# Patient Record
Sex: Male | Born: 1945 | Race: White | Hispanic: No | Marital: Single | Smoking: Former smoker
Health system: Southern US, Community
[De-identification: ages and names within clinical notes are randomized; demographics above are authoritative.]

## PROBLEM LIST (undated history)

## (undated) DIAGNOSIS — J449 Chronic obstructive pulmonary disease, unspecified: Secondary | ICD-10-CM

## (undated) DIAGNOSIS — I779 Disorder of arteries and arterioles, unspecified: Secondary | ICD-10-CM

## (undated) DIAGNOSIS — E785 Hyperlipidemia, unspecified: Secondary | ICD-10-CM

## (undated) DIAGNOSIS — I739 Peripheral vascular disease, unspecified: Secondary | ICD-10-CM

## (undated) DIAGNOSIS — I252 Old myocardial infarction: Secondary | ICD-10-CM

## (undated) DIAGNOSIS — I255 Ischemic cardiomyopathy: Secondary | ICD-10-CM

## (undated) DIAGNOSIS — I701 Atherosclerosis of renal artery: Secondary | ICD-10-CM

## (undated) DIAGNOSIS — I5022 Chronic systolic (congestive) heart failure: Secondary | ICD-10-CM

## (undated) DIAGNOSIS — N189 Chronic kidney disease, unspecified: Secondary | ICD-10-CM

## (undated) DIAGNOSIS — I2510 Atherosclerotic heart disease of native coronary artery without angina pectoris: Secondary | ICD-10-CM

## (undated) DIAGNOSIS — I10 Essential (primary) hypertension: Secondary | ICD-10-CM

## (undated) HISTORY — DX: Hyperlipidemia, unspecified: E78.5

## (undated) HISTORY — DX: Chronic kidney disease, unspecified: N18.9

## (undated) HISTORY — DX: Atherosclerotic heart disease of native coronary artery without angina pectoris: I25.10

## (undated) HISTORY — DX: Disorder of arteries and arterioles, unspecified: I77.9

## (undated) HISTORY — DX: Chronic systolic (congestive) heart failure: I50.22

## (undated) HISTORY — DX: Ischemic cardiomyopathy: I25.5

## (undated) HISTORY — DX: Peripheral vascular disease, unspecified: I73.9

## (undated) HISTORY — DX: Old myocardial infarction: I25.2

## (undated) HISTORY — DX: Essential (primary) hypertension: I10

## (undated) HISTORY — DX: Atherosclerosis of renal artery: I70.1

---

## 2006-05-21 DIAGNOSIS — I252 Old myocardial infarction: Secondary | ICD-10-CM

## 2006-05-21 DIAGNOSIS — I2510 Atherosclerotic heart disease of native coronary artery without angina pectoris: Secondary | ICD-10-CM

## 2006-05-21 HISTORY — PX: CORONARY ARTERY BYPASS GRAFT: SHX141

## 2006-05-21 HISTORY — DX: Atherosclerotic heart disease of native coronary artery without angina pectoris: I25.10

## 2006-05-21 HISTORY — PX: MITRAL VALVE ANNULOPLASTY: SHX2038

## 2006-05-21 HISTORY — DX: Old myocardial infarction: I25.2

## 2007-02-24 ENCOUNTER — Inpatient Hospital Stay (HOSPITAL_COMMUNITY): Admission: EM | Admit: 2007-02-24 | Discharge: 2007-03-04 | Payer: Self-pay | Admitting: Cardiovascular Disease

## 2007-02-24 ENCOUNTER — Ambulatory Visit: Payer: Self-pay | Admitting: Thoracic Surgery (Cardiothoracic Vascular Surgery)

## 2007-02-24 ENCOUNTER — Ambulatory Visit: Payer: Self-pay | Admitting: Cardiovascular Disease

## 2007-03-17 ENCOUNTER — Encounter
Admission: RE | Admit: 2007-03-17 | Discharge: 2007-03-17 | Payer: Self-pay | Admitting: Thoracic Surgery (Cardiothoracic Vascular Surgery)

## 2007-03-17 ENCOUNTER — Ambulatory Visit: Payer: Self-pay | Admitting: Thoracic Surgery (Cardiothoracic Vascular Surgery)

## 2007-03-26 ENCOUNTER — Ambulatory Visit: Payer: Self-pay | Admitting: Cardiovascular Disease

## 2007-04-04 ENCOUNTER — Ambulatory Visit: Payer: Self-pay | Admitting: Cardiothoracic Surgery

## 2007-04-04 ENCOUNTER — Encounter: Admission: RE | Admit: 2007-04-04 | Discharge: 2007-04-04 | Payer: Self-pay | Admitting: Cardiothoracic Surgery

## 2007-04-30 ENCOUNTER — Encounter: Payer: Self-pay | Admitting: Cardiovascular Disease

## 2007-04-30 ENCOUNTER — Ambulatory Visit: Payer: Self-pay | Admitting: Cardiovascular Disease

## 2007-04-30 ENCOUNTER — Ambulatory Visit: Payer: Self-pay

## 2007-04-30 LAB — CONVERTED CEMR LAB
ALT: 13 units/L (ref 0–53)
Albumin: 3.5 g/dL (ref 3.5–5.2)
Alkaline Phosphatase: 117 units/L (ref 39–117)
BUN: 21 mg/dL (ref 6–23)
Cholesterol: 110 mg/dL (ref 0–200)
Eosinophils Relative: 4.4 % (ref 0.0–5.0)
GFR calc Af Amer: 61 mL/min
HCT: 33.2 % — ABNORMAL LOW (ref 39.0–52.0)
Hemoglobin: 11 g/dL — ABNORMAL LOW (ref 13.0–17.0)
LDL Cholesterol: 69 mg/dL (ref 0–99)
Lymphocytes Relative: 27.8 % (ref 12.0–46.0)
MCHC: 33.1 g/dL (ref 30.0–36.0)
MCV: 85.5 fL (ref 78.0–100.0)
Monocytes Relative: 6.8 % (ref 3.0–11.0)
Neutrophils Relative %: 60.5 % (ref 43.0–77.0)
RBC: 3.88 M/uL — ABNORMAL LOW (ref 4.22–5.81)
RDW: 15.9 % — ABNORMAL HIGH (ref 11.5–14.6)
Sodium: 141 meq/L (ref 135–145)
Total CHOL/HDL Ratio: 4.1
Total Protein: 7.4 g/dL (ref 6.0–8.3)
Triglycerides: 74 mg/dL (ref 0–149)
VLDL: 15 mg/dL (ref 0–40)
WBC: 9.3 10*3/uL (ref 4.5–10.5)

## 2007-07-01 ENCOUNTER — Ambulatory Visit: Payer: Self-pay | Admitting: Cardiovascular Disease

## 2007-07-28 ENCOUNTER — Ambulatory Visit: Payer: Self-pay | Admitting: Cardiovascular Disease

## 2007-09-29 ENCOUNTER — Ambulatory Visit: Payer: Self-pay | Admitting: Cardiovascular Disease

## 2007-09-29 ENCOUNTER — Encounter: Payer: Self-pay | Admitting: Cardiovascular Disease

## 2007-09-29 ENCOUNTER — Ambulatory Visit: Payer: Self-pay

## 2008-01-23 ENCOUNTER — Ambulatory Visit: Payer: Self-pay | Admitting: Cardiovascular Disease

## 2008-01-23 LAB — CONVERTED CEMR LAB
ALT: 16 units/L (ref 0–53)
Albumin: 3.6 g/dL (ref 3.5–5.2)
Basophils Absolute: 0.1 10*3/uL (ref 0.0–0.1)
Basophils Relative: 0.9 % (ref 0.0–3.0)
Calcium: 9.2 mg/dL (ref 8.4–10.5)
Cholesterol: 88 mg/dL (ref 0–200)
Creatinine, Ser: 1.5 mg/dL (ref 0.4–1.5)
GFR calc non Af Amer: 51 mL/min
Glucose, Bld: 98 mg/dL (ref 70–99)
HCT: 36.8 % — ABNORMAL LOW (ref 39.0–52.0)
Hemoglobin: 12.5 g/dL — ABNORMAL LOW (ref 13.0–17.0)
LDL Cholesterol: 52 mg/dL (ref 0–99)
Lymphocytes Relative: 22.7 % (ref 12.0–46.0)
MCV: 81.6 fL (ref 78.0–100.0)
Monocytes Relative: 7.7 % (ref 3.0–12.0)
Potassium: 3.9 meq/L (ref 3.5–5.1)
RBC: 4.5 M/uL (ref 4.22–5.81)
Sodium: 145 meq/L (ref 135–145)
Total CHOL/HDL Ratio: 3.3
Total Protein: 6.8 g/dL (ref 6.0–8.3)
Triglycerides: 51 mg/dL (ref 0–149)
WBC: 7.5 10*3/uL (ref 4.5–10.5)

## 2008-05-03 ENCOUNTER — Ambulatory Visit: Payer: Self-pay | Admitting: Cardiovascular Disease

## 2008-05-03 LAB — CONVERTED CEMR LAB
ALT: 16 units/L (ref 0–53)
AST: 21 units/L (ref 0–37)
Alkaline Phosphatase: 163 units/L — ABNORMAL HIGH (ref 39–117)
Total Protein: 6.6 g/dL (ref 6.0–8.3)

## 2008-05-13 DIAGNOSIS — E785 Hyperlipidemia, unspecified: Secondary | ICD-10-CM | POA: Insufficient documentation

## 2008-05-13 DIAGNOSIS — I5022 Chronic systolic (congestive) heart failure: Secondary | ICD-10-CM | POA: Insufficient documentation

## 2008-05-13 DIAGNOSIS — I2589 Other forms of chronic ischemic heart disease: Secondary | ICD-10-CM

## 2008-05-13 DIAGNOSIS — I25810 Atherosclerosis of coronary artery bypass graft(s) without angina pectoris: Secondary | ICD-10-CM

## 2008-06-30 ENCOUNTER — Ambulatory Visit: Payer: Self-pay | Admitting: Cardiology

## 2008-07-21 ENCOUNTER — Ambulatory Visit: Payer: Self-pay | Admitting: Cardiovascular Disease

## 2008-07-30 ENCOUNTER — Encounter: Payer: Self-pay | Admitting: Cardiovascular Disease

## 2008-07-30 ENCOUNTER — Ambulatory Visit: Payer: Self-pay | Admitting: Cardiovascular Disease

## 2008-07-30 LAB — CONVERTED CEMR LAB
CO2: 32 meq/L (ref 19–32)
Creatinine, Ser: 1.3 mg/dL (ref 0.4–1.5)
GFR calc non Af Amer: 60 mL/min
Glucose, Bld: 78 mg/dL (ref 70–99)
Potassium: 4.2 meq/L (ref 3.5–5.1)

## 2008-08-31 ENCOUNTER — Encounter: Payer: Self-pay | Admitting: Cardiovascular Disease

## 2008-08-31 ENCOUNTER — Ambulatory Visit: Payer: Self-pay | Admitting: Cardiovascular Disease

## 2008-09-01 LAB — CONVERTED CEMR LAB
BUN: 23 mg/dL (ref 6–23)
Calcium: 9.2 mg/dL (ref 8.4–10.5)
Creatinine, Ser: 1.6 mg/dL — ABNORMAL HIGH (ref 0.4–1.5)
GFR calc non Af Amer: 46.82 mL/min (ref >60)
Glucose, Bld: 90 mg/dL (ref 70–99)
Pro B Natriuretic peptide (BNP): 672 pg/mL — ABNORMAL HIGH (ref 0.0–100.0)
Sodium: 146 meq/L — ABNORMAL HIGH (ref 135–145)

## 2008-10-25 ENCOUNTER — Ambulatory Visit: Payer: Self-pay | Admitting: Cardiovascular Disease

## 2009-03-22 ENCOUNTER — Ambulatory Visit: Payer: Self-pay | Admitting: Cardiovascular Disease

## 2009-03-30 LAB — CONVERTED CEMR LAB
ALT: 27 units/L (ref 0–53)
AST: 26 units/L (ref 0–37)
Albumin: 3.9 g/dL (ref 3.5–5.2)
Alkaline Phosphatase: 228 units/L — ABNORMAL HIGH (ref 39–117)
Chloride: 106 meq/L (ref 96–112)
Direct LDL: 91.2 mg/dL
GFR calc non Af Amer: 29.27 mL/min (ref >60)
Sodium: 142 meq/L (ref 135–145)
Total CHOL/HDL Ratio: 5
Total Protein: 7.5 g/dL (ref 6.0–8.3)

## 2009-07-04 ENCOUNTER — Ambulatory Visit: Payer: Self-pay | Admitting: Cardiovascular Disease

## 2009-07-04 LAB — CONVERTED CEMR LAB
BUN: 19 mg/dL (ref 6–23)
Calcium: 8.9 mg/dL (ref 8.4–10.5)
Creatinine, Ser: 2 mg/dL — ABNORMAL HIGH (ref 0.4–1.5)
GFR calc non Af Amer: 36.09 mL/min (ref >60)
Sodium: 145 meq/L (ref 135–145)

## 2009-07-16 IMAGING — CR DG CHEST 2V
2 series · 2 of 2 positions shown · non-contrast
Comparison: 03/01/07.

CLINICAL DATA: 61-year-old male acute MI.  Status post coronary bypass, cough, and congestion.
CHEST - 2 VIEW:

[w chest pa]
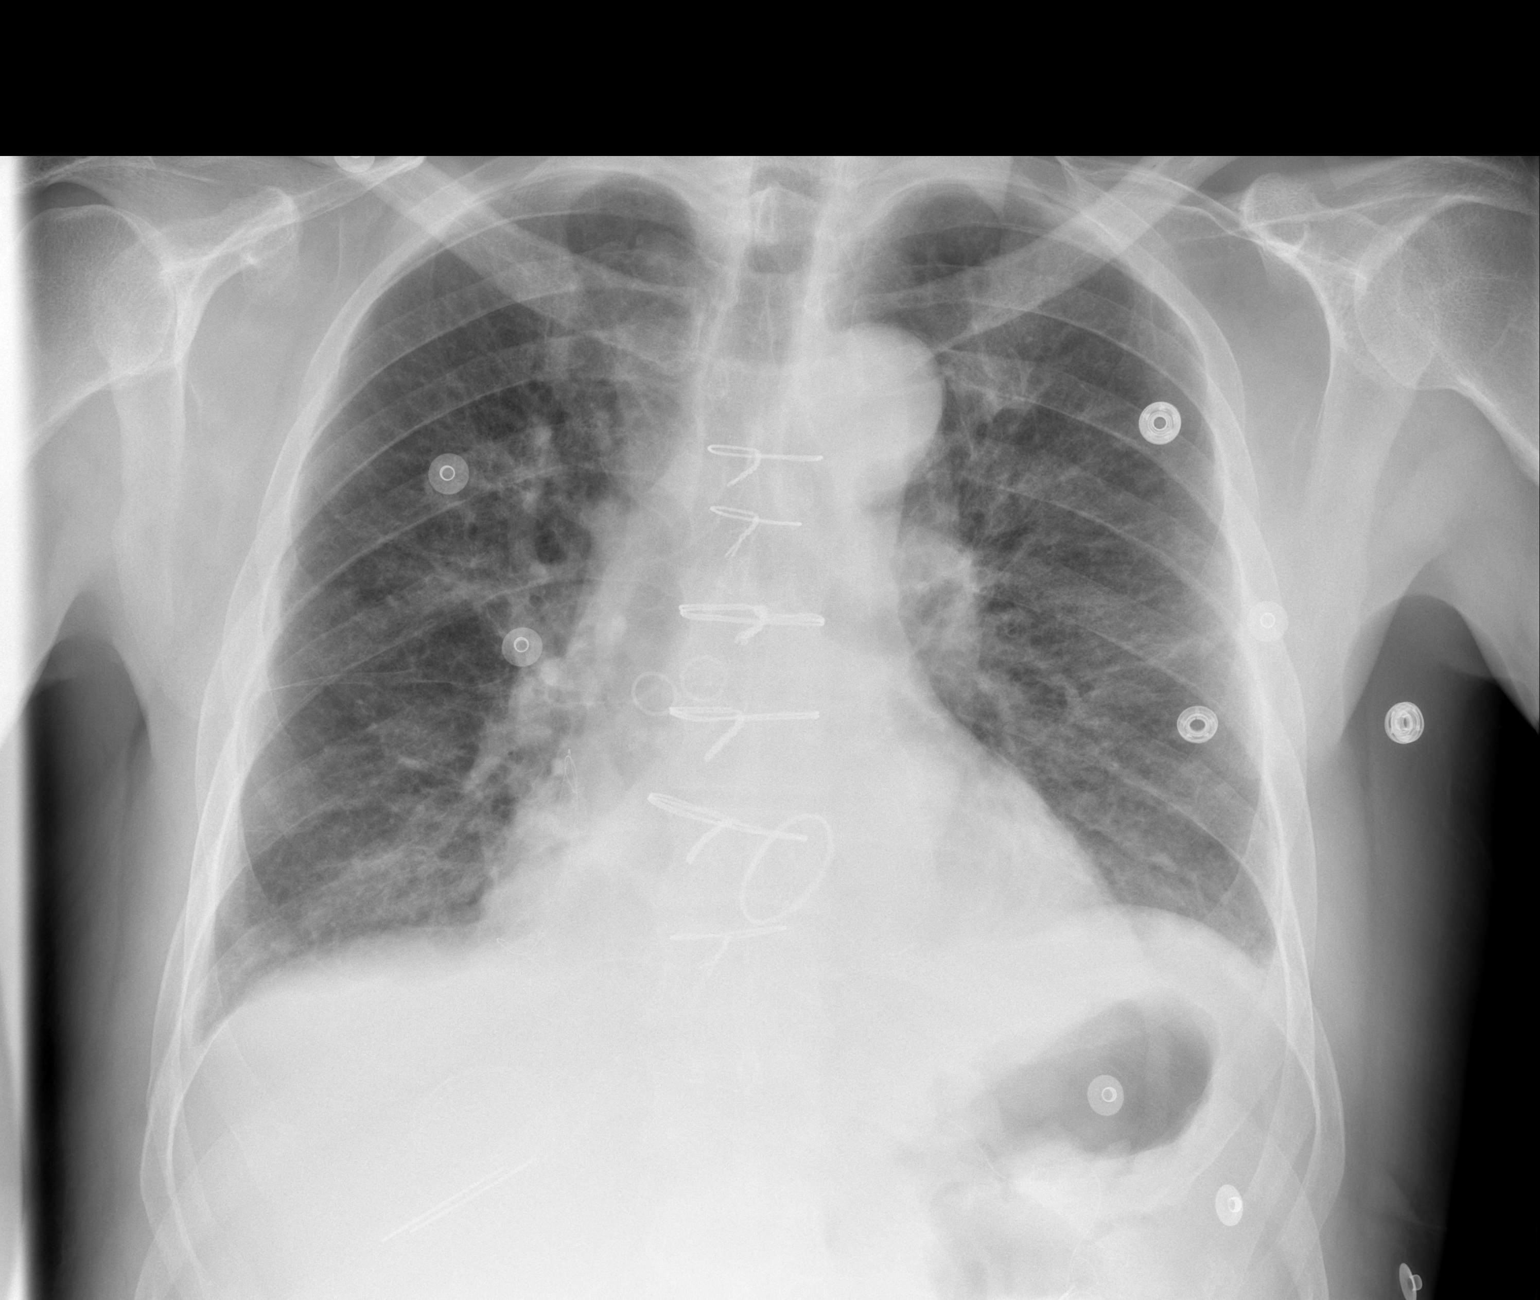

[w chest lat]
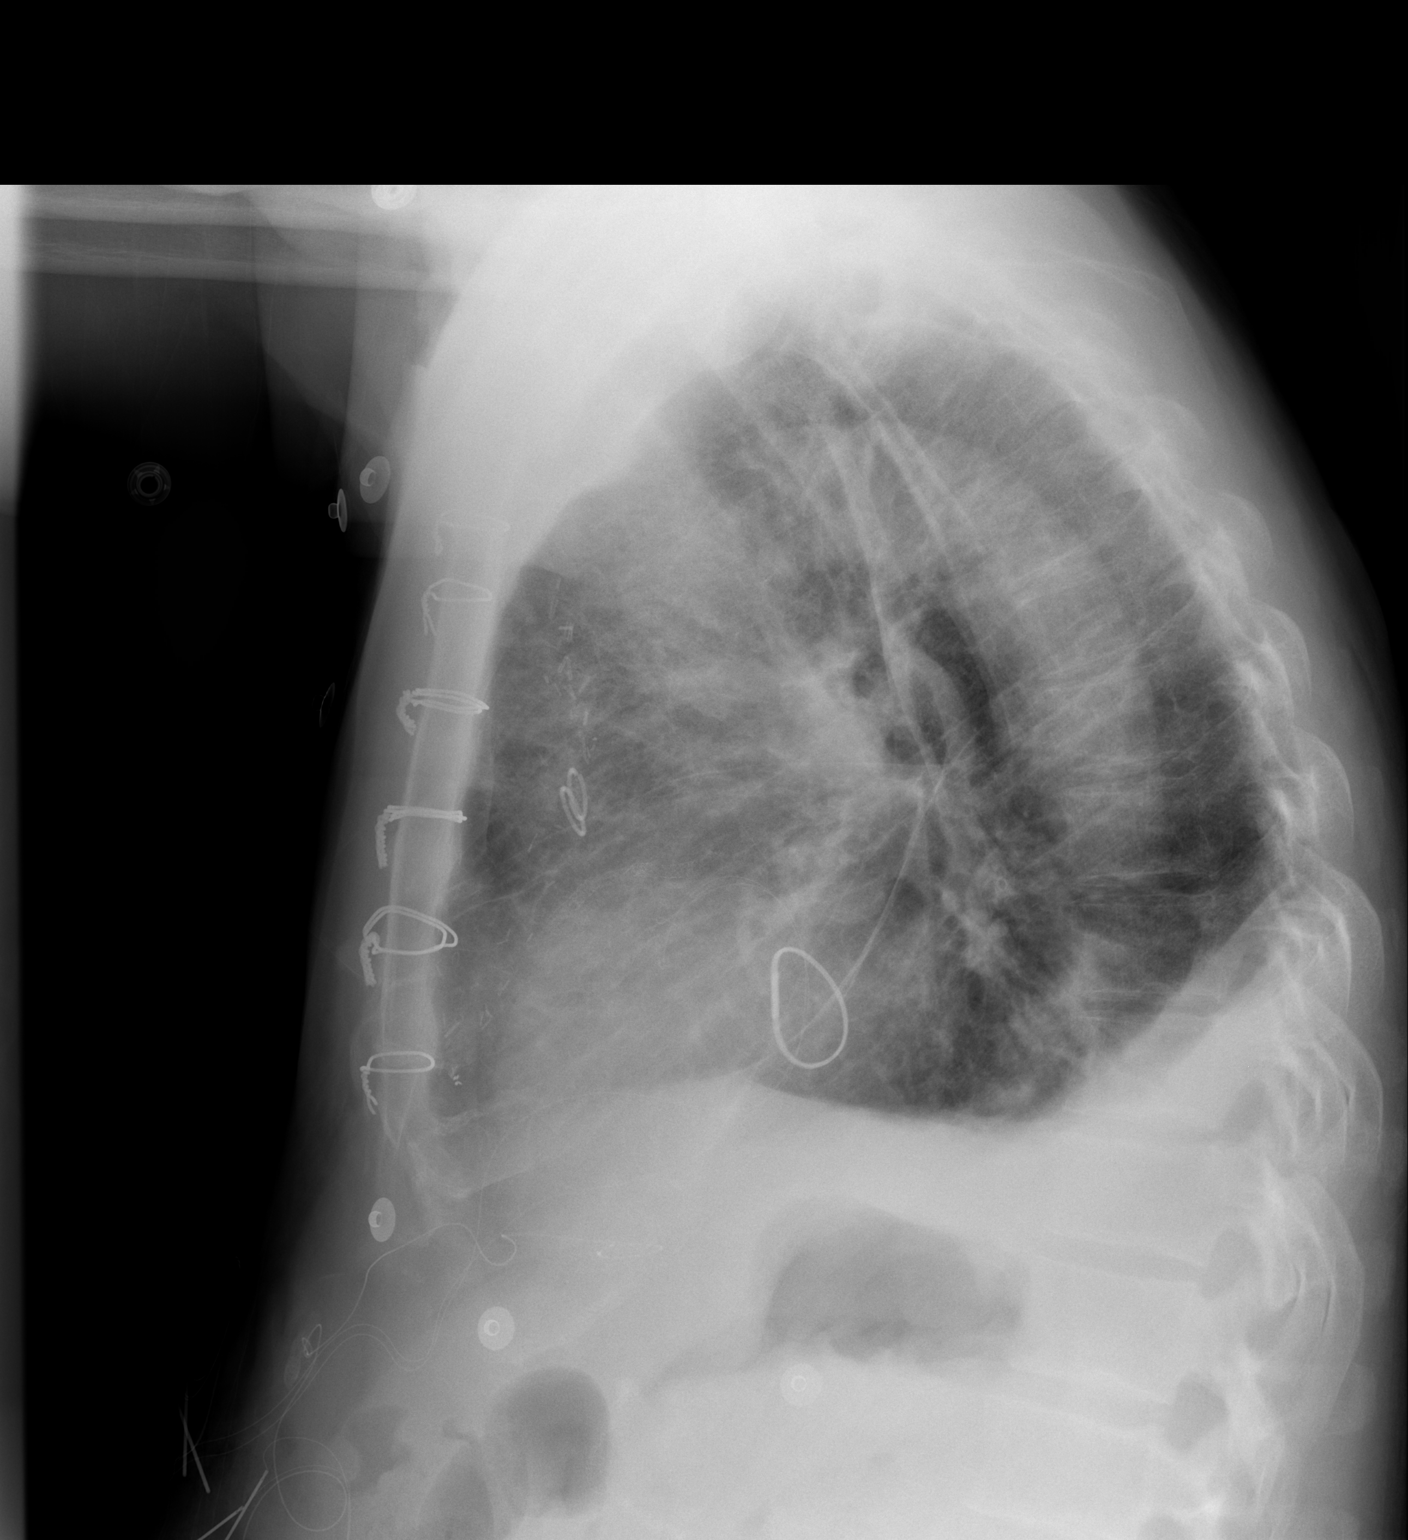

[2 of 2 positions shown; findings below may reference images not displayed]

FINDINGS: The right IJ vascular sheath has been removed. The patient is status post median sternotomy for coronary bypass and prosthetic heart valve. The heart is enlarged with improving edema and bibasilar atelectasis. Small effusions persist. No pneumothorax.
IMPRESSION: Improving edema and atelectasis.

## 2009-09-27 ENCOUNTER — Ambulatory Visit: Payer: Self-pay | Admitting: Cardiovascular Disease

## 2010-01-30 ENCOUNTER — Ambulatory Visit: Payer: Self-pay | Admitting: Cardiovascular Disease

## 2010-02-03 ENCOUNTER — Telehealth: Payer: Self-pay | Admitting: Cardiovascular Disease

## 2010-02-03 DIAGNOSIS — I701 Atherosclerosis of renal artery: Secondary | ICD-10-CM

## 2010-02-03 LAB — CONVERTED CEMR LAB
AST: 16 units/L (ref 0–37)
Albumin: 4 g/dL (ref 3.5–5.2)
Alkaline Phosphatase: 156 units/L — ABNORMAL HIGH (ref 39–117)
BUN: 26 mg/dL — ABNORMAL HIGH (ref 6–23)
CO2: 27 meq/L (ref 19–32)
GFR calc non Af Amer: 31.13 mL/min (ref >60)
Glucose, Bld: 103 mg/dL — ABNORMAL HIGH (ref 70–99)
LDL Cholesterol: 67 mg/dL (ref 0–99)
Total CHOL/HDL Ratio: 5
Total Protein: 7.1 g/dL (ref 6.0–8.3)

## 2010-02-15 ENCOUNTER — Encounter: Payer: Self-pay | Admitting: Cardiovascular Disease

## 2010-02-15 ENCOUNTER — Ambulatory Visit: Payer: Self-pay

## 2010-05-25 ENCOUNTER — Encounter: Payer: Self-pay | Admitting: Cardiovascular Disease

## 2010-05-25 ENCOUNTER — Ambulatory Visit
Admission: RE | Admit: 2010-05-25 | Discharge: 2010-05-25 | Payer: Self-pay | Source: Home / Self Care | Attending: Cardiovascular Disease | Admitting: Cardiovascular Disease

## 2010-06-20 NOTE — Progress Notes (Signed)
Summary: Results  Phone Note Call from Patient Call back at Home Phone [phone]   Caller: Patient Reason for Call: Talk to Nurse, Talk to Doctor, Lab or Test Results Summary of Call: pt rtn call to get lab results Initial call taken by: Omer Jack,  February 03, 2010 9:13 AM  Follow-up for Phone Call        Spoke with pt. Patient aware of labs results and MD recommendations. A renal duplex was  order. Pt. is aware that the scheduler will call him for date and time of test. Pt. verbalized uinderstanding Follow-up by: Ollen Gross, RN, BSN,  February 03, 2010 9:54 AM  New Problems: RENAL ARTERY STENOSIS (ICD-440.1)   New Problems: RENAL ARTERY STENOSIS (ICD-440.1)

## 2010-06-20 NOTE — Assessment & Plan Note (Signed)
Summary: f60m   Visit Type:  Follow-up Primary Provider:  none  CC:  No complains.  History of Present Illness: 65 year-old male with severe ischemic cardiomyopathy and chronic heart failure presents today for follow-up.   LVEF is about 30% with LBBB, and he has declined cardiac resynchronization or a defibrillator.   The patient denies chest pain, orthopnea, PND, edema, palpitations, lightheadedness, or syncope.  He has stable exertional dyspnea. He's not engaged in regular exercise. He has been out fishing with his son, and he goes for an occasional walk. No symptoms with low-level physical activities.   Current Medications (verified): 1)  Digoxin 0.125 Mg Tabs (Digoxin) .... Take 1 Tablet By Mouth Once A Day 2)  Potassium Chloride 20 Meq/81ml Soln (Potassium Chloride) .... Take One Tablet Once Daily 3)  Carvedilol 6.25 Mg Tabs (Carvedilol) .... Take One Tablet Two Times A Day 4)  Furosemide 80 Mg Tabs (Furosemide) .... Take One Tablet By Mouth Daily 5)  Simvastatin 40 Mg Tabs (Simvastatin) .... Take 1 Tablet By Mouth Once A Day 6)  Aspirin Ec 325 Mg Tbec (Aspirin) .... Take One Tablet By Mouth Daily 7)  Diovan 40 Mg Tabs (Valsartan) .... Take One Tablet By Mouth Twice A Day  Allergies (verified): No Known Drug Allergies  Past History:  Past medical history reviewed for relevance to current acute and chronic problems.  Past Medical History: Reviewed history from 07/04/2009 and no changes required. CAD s/p multiple MI's and emergency CABG 2008 Dyslipidemia COPD Severe Ischemic Cardiomyopathy, LVEF less than 30% - has refused ICD Hypertension Tobacco Abuse - former  Review of Systems       Negative except as per HPI   Vital Signs:  Patient profile:   65 year old male Height:      70 inches Weight:      178.50 pounds BMI:     25.70 Pulse rate:   56 / minute Pulse rhythm:   regular Resp:     18 per minute BP sitting:   110 / 74  (left arm) Cuff size:    large  Vitals Entered By: Vikki Ports (January 30, 2010 8:58 AM)  Physical Exam  General:  Pt is alert and oriented, in no acute distress. HEENT: normal Neck: normal carotid upstrokes without bruits, JVP normal Lungs: CTA CV: RRR without murmur or gallop Abd: soft, NT, positive BS, no bruit, no organomegaly Ext: no clubbing, cyanosis, or edema. peripheral pulses 2+ and equal Skin: warm and dry without rash    Impression & Recommendations:  Problem # 1:  CAD, ARTERY BYPASS GRAFT (ICD-414.04)  Pt remains stable without angina. Will continue his current medical program and see him back in 4 months.  His updated medication list for this problem includes:    Carvedilol 6.25 Mg Tabs (Carvedilol) .Marland Kitchen... Take one tablet two times a day    Aspirin Ec 325 Mg Tbec (Aspirin) .Marland Kitchen... Take one tablet by mouth daily  Orders: TLB-BMP (Basic Metabolic Panel-BMET) (80048-METABOL) TLB-Lipid Panel (80061-LIPID) TLB-Hepatic/Liver Function Pnl (80076-HEPATIC)  Problem # 2:  SYSTOLIC HEART FAILURE, CHRONIC (ICD-428.22) Pt stable without evidence of volume overload. Continue meds as below and followup with a CXR, metabolic panel.  His updated medication list for this problem includes:    Digoxin 0.125 Mg Tabs (Digoxin) .Marland Kitchen... Take 1 tablet by mouth once a day    Carvedilol 6.25 Mg Tabs (Carvedilol) .Marland Kitchen... Take one tablet two times a day    Furosemide 80 Mg Tabs (Furosemide) .Marland Kitchen... Take  one tablet by mouth daily    Aspirin Ec 325 Mg Tbec (Aspirin) .Marland Kitchen... Take one tablet by mouth daily    Diovan 40 Mg Tabs (Valsartan) .Marland Kitchen... Take one tablet by mouth twice a day  Problem # 3:  HYPERLIPIDEMIA-MIXED (ICD-272.4) Draw lipids this am as pt is fasting.  His updated medication list for this problem includes:    Simvastatin 40 Mg Tabs (Simvastatin) .Marland Kitchen... Take 1 tablet by mouth once a day  CHOL: 143 (03/22/2009)   LDL: 52 (01/23/2008)   HDL: 30.10 (03/22/2009)   TG: 207.0 (03/22/2009)  Orders: TLB-Lipid  Panel (80061-LIPID)  Patient Instructions: 1)  Your physician recommends that you schedule a follow-up appointment in: 4 months 2)  Your physician recommends that you have a FASTING lipid profile, liver function test, & BMP today. 3)  Your physician recommends that you continue on your current medications as directed. Please refer to the Current Medication list given to you today. 4)  A chest x-ray will be obtained in 4 months.

## 2010-06-20 NOTE — Assessment & Plan Note (Signed)
Summary: 3 month rov   Visit Type:  Follow-up Primary Provider:  none  CC:  no complaints.  History of Present Illness: 65 year-old male with severe ischemic cardiomyopathy and chronic heart failure presents today for follow-up.   LVEF is about 30% with LBBB, and he has declined cardiac resynchronization or a defibrillator. He has no symptoms at his current activity level. The patient denies chest pain, dyspnea, orthopnea, PND, edema, palpitations, lightheadedness, or syncope. He is not engaged in exercise.   Current Medications (verified): 1)  Digoxin 0.125 Mg Tabs (Digoxin) .... Take 1 Tablet By Mouth Once A Day 2)  Potassium Chloride 20 Meq/39ml Soln (Potassium Chloride) .... Take One Tablet Once Daily 3)  Carvedilol 6.25 Mg Tabs (Carvedilol) .... Take One Tablet Two Times A Day 4)  Furosemide 80 Mg Tabs (Furosemide) .... Take One Tablet By Mouth Daily 5)  Simvastatin 40 Mg Tabs (Simvastatin) .... Take 1 Tablet By Mouth Once A Day 6)  Aspirin Ec 325 Mg Tbec (Aspirin) .... Take One Tablet By Mouth Daily 7)  Diovan 40 Mg Tabs (Valsartan) .... Take One Tablet By Mouth Twice A Day  Allergies: No Known Drug Allergies  Past History:  Past medical history reviewed for relevance to current acute and chronic problems.  Past Medical History: Reviewed history from 07/04/2009 and no changes required. CAD s/p multiple MI's and emergency CABG 2008 Dyslipidemia COPD Severe Ischemic Cardiomyopathy, LVEF less than 30% - has refused ICD Hypertension Tobacco Abuse - former  Review of Systems       Negative except as per HPI   Vital Signs:  Patient profile:   65 year old male Height:      70 inches Weight:      181 pounds Pulse rate:   64 / minute Pulse rhythm:   regular Resp:     16 per minute BP sitting:   92 / 64  (right arm)  Vitals Entered By: Jacquelin Hawking, CMA (Sep 27, 2009 10:13 AM)  Physical Exam  General:  Pt is alert and oriented, in no acute distress. HEENT:  normal Neck: normal carotid upstrokes without bruits, JVP normal Lungs: CTA CV: RRR without murmur or gallop Abd: soft, NT, positive BS, no bruit, no organomegaly Ext: no clubbing, cyanosis, or edema. peripheral pulses 2+ and equal Skin: warm and dry without rash    EKG  Procedure date:  09/27/2009  Findings:      Sinus brady, LBBB, HR 56 bpm, 1st degree AVB.  Impression & Recommendations:  Problem # 1:  CAD, ARTERY BYPASS GRAFT (ICD-414.04) Stable without angina. Continue current Rx.  His updated medication list for this problem includes:    Carvedilol 6.25 Mg Tabs (Carvedilol) .Marland Kitchen... Take one tablet two times a day    Aspirin Ec 325 Mg Tbec (Aspirin) .Marland Kitchen... Take one tablet by mouth daily  Orders: EKG w/ Interpretation (93000)  Problem # 2:  SYSTOLIC HEART FAILURE, CHRONIC (ICD-428.22) Pt is stable.  BP remains about 90 mmHg preculuding uptitration of meds.  His updated medication list for this problem includes:    Digoxin 0.125 Mg Tabs (Digoxin) .Marland Kitchen... Take 1 tablet by mouth once a day    Carvedilol 6.25 Mg Tabs (Carvedilol) .Marland Kitchen... Take one tablet two times a day    Furosemide 80 Mg Tabs (Furosemide) .Marland Kitchen... Take one tablet by mouth daily    Aspirin Ec 325 Mg Tbec (Aspirin) .Marland Kitchen... Take one tablet by mouth daily    Diovan 40 Mg Tabs (Valsartan) .Marland Kitchen... Take  one tablet by mouth twice a day  Orders: EKG w/ Interpretation (93000)  Problem # 3:  HYPERLIPIDEMIA-MIXED (ICD-272.4) Will f/u labs when he returns in 4 months.  His updated medication list for this problem includes:    Simvastatin 40 Mg Tabs (Simvastatin) .Marland Kitchen... Take 1 tablet by mouth once a day  CHOL: 143 (03/22/2009)   LDL: 52 (01/23/2008)   HDL: 30.10 (03/22/2009)   TG: 207.0 (03/22/2009)  Patient Instructions: 1)  Your physician recommends that you schedule a follow-up appointment in: 4 MONTHS 2)  Your physician recommends that you return for lab work in: 4 MONTHS (LIPID, BMP, LIVER 428.22, 272.0, 414.04)--Nothing  to eat or drink after midnight for bloodwork 3)  A chest x-ray takes a picture of the organs and structures inside the chest, including the heart, lungs, and blood vessels. This test can show several things, including, whether the heart is enlarged; whether fluid is building up in the lungs; and whether pacemaker / defibrillator leads are still in place.  This will be done in 4 MONTHS. 4)  Your physician recommends that you continue on your current medications as directed. Please refer to the Current Medication list given to you today.

## 2010-06-20 NOTE — Assessment & Plan Note (Signed)
Summary: ROV   Visit Type:  3 months follow up Primary Provider:  none  CC:  No complaints.  History of Present Illness: 65 year-old male with severe ischemic cardiomyopathy and chronic heart failure presents today for follow-up.   He is doing well at present.  Reports compliance with his medications. Appetite is good. No shortness of breath, orthopnea, PND, edema, or chest pain. No lightheadedness or syncope. No complaints today. He has been sedentary - not engaged in regular walking or exercise.  Current Medications (verified): 1)  Digoxin 0.125 Mg Tabs (Digoxin) .... Take 1 Tablet By Mouth Once A Day 2)  Potassium Chloride 20 Meq/67ml Soln (Potassium Chloride) .... Take One Tablet Once Daily 3)  Carvedilol 6.25 Mg Tabs (Carvedilol) .... Take One Tablet Two Times A Day 4)  Furosemide 80 Mg Tabs (Furosemide) .... Take One-Half Tablet Two Times A Day 5)  Simvastatin 40 Mg Tabs (Simvastatin) .... Take 1 Tablet By Mouth Once A Day 6)  Aspirin Ec 325 Mg Tbec (Aspirin) .... Take One Tablet By Mouth Daily 7)  Diovan 40 Mg Tabs (Valsartan) .... Take One Tablet By Mouth Twice A Day  Allergies (verified): No Known Drug Allergies  Past History:  Past medical history reviewed for relevance to current acute and chronic problems.  Past Medical History: CAD s/p multiple MI's and emergency CABG 2008 Dyslipidemia COPD Severe Ischemic Cardiomyopathy, LVEF less than 30% - has refused ICD Hypertension Tobacco Abuse - former  Review of Systems       Reports swollen left great toe with mild pain, otherwise negative except as per HPI  Vital Signs:  Patient profile:   65 year old male Height:      70 inches Weight:      186.25 pounds BMI:     26.82 Pulse rate:   68 / minute Pulse rhythm:   regular Resp:     18 per minute BP sitting:   100 / 68  (left arm) Cuff size:   large  Vitals Entered By: Vikki Ports (July 04, 2009 8:45 AM)  Physical Exam  General:  Pt is alert and  oriented, in no acute distress. HEENT: normal Neck: normal carotid upstrokes without bruits, JVP normal Lungs: CTA CV: RRR without murmur or gallop Abd: soft, NT, positive BS, no bruit, no organomegaly Ext: no clubbing, cyanosis, or edema. peripheral pulses 2+ and equal Skin: warm and dry without rash    Impression & Recommendations:  Problem # 1:  CAD, ARTERY BYPASS GRAFT (ICD-414.04)  Stable without angina. Encouraged increased activity/exercise. Continue ASA, beta-blocker, secondary risk reduction measures.  His updated medication list for this problem includes:    Carvedilol 6.25 Mg Tabs (Carvedilol) .Marland Kitchen... Take one tablet two times a day    Aspirin Ec 325 Mg Tbec (Aspirin) .Marland Kitchen... Take one tablet by mouth daily  Orders: TLB-BMP (Basic Metabolic Panel-BMET) (80048-METABOL)  Problem # 2:  SYSTOLIC HEART FAILURE, CHRONIC (ICD-428.22) Appears euvolemic by exam. change lasix to 80 mg once daily at pt's request. Continue current therapy otherwise. Check metabolic panel today - creatinine was elevated at last check (2.4 mg/dL) and lasix was reduced at that time.  His updated medication list for this problem includes:    Digoxin 0.125 Mg Tabs (Digoxin) .Marland Kitchen... Take 1 tablet by mouth once a day    Carvedilol 6.25 Mg Tabs (Carvedilol) .Marland Kitchen... Take one tablet two times a day    Furosemide 80 Mg Tabs (Furosemide) .Marland Kitchen... Take one tablet by mouth daily  Aspirin Ec 325 Mg Tbec (Aspirin) .Marland Kitchen... Take one tablet by mouth daily    Diovan 40 Mg Tabs (Valsartan) .Marland Kitchen... Take one tablet by mouth twice a day  Problem # 3:  HYPERLIPIDEMIA-MIXED (ICD-272.4) Limited to generic meds, LDL less than 100 mg/dL - continue simvastatin.  His updated medication list for this problem includes:    Simvastatin 40 Mg Tabs (Simvastatin) .Marland Kitchen... Take 1 tablet by mouth once a day  CHOL: 143 (03/22/2009)   LDL: 52 (01/23/2008)   HDL: 30.10 (03/22/2009)   TG: 207.0 (03/22/2009)  Patient Instructions: 1)  Your physician  recommends that you schedule a follow-up appointment in: 3 MONTHS 2)  Your physician recommends that you have lab work today: BMP  3)  Your physician has recommended you make the following change in your medication: Take Furosemide (Lasix) 80mg  once a day Prescriptions: FUROSEMIDE 80 MG TABS (FUROSEMIDE) Take one tablet by mouth daily  #30 x 8   Entered by:   Julieta Gutting, RN, BSN   Authorized by:   Norva Karvonen, MD   Signed by:   Julieta Gutting, RN, BSN on 07/04/2009   Method used:   Electronically to        [facility] [address]* (retail)       [address] [place], [place]  [address]       Ph: [phone]       Fax: [phone]   RxID:   [id number]

## 2010-06-22 NOTE — Assessment & Plan Note (Signed)
Summary: 4 month rov   Visit Type:  4 months follow up Primary Provider:  none  CC:  No complaints.  History of Present Illness: 65 year-old male with severe ischemic cardiomyopathy and chronic heart failure presents today for follow-up.   LVEF is about 30% with LBBB, and he has declined cardiac resynchronization or a defibrillator.   The patient denies chest pain, orthopnea, PND, edema, palpitations, lightheadedness, or syncope. He has a sedentary lifestyle. The patient has no complaints at present.   Current Medications (verified): 1)  Digoxin 0.125 Mg Tabs (Digoxin) .... Take 1 Tablet By Mouth Once A Day 2)  Potassium Chloride 20 Meq/8ml Soln (Potassium Chloride) .... Take One Tablet Once Daily 3)  Carvedilol 6.25 Mg Tabs (Carvedilol) .... Take One Tablet Two Times A Day 4)  Furosemide 80 Mg Tabs (Furosemide) .... Take One Tablet By Mouth Daily 5)  Simvastatin 40 Mg Tabs (Simvastatin) .... Take 1 Tablet By Mouth Once A Day 6)  Aspirin Ec 325 Mg Tbec (Aspirin) .... Take One Tablet By Mouth Daily 7)  Diovan 40 Mg Tabs (Valsartan) .... Take One Tablet By Mouth Twice A Day  Allergies (verified): No Known Drug Allergies  Past History:  Past medical history reviewed for relevance to current acute and chronic problems.  Past Medical History: Reviewed history from 07/04/2009 and no changes required. CAD s/p multiple MI's and emergency CABG 2008 Dyslipidemia COPD Severe Ischemic Cardiomyopathy, LVEF less than 30% - has refused ICD Hypertension Tobacco Abuse - former  Review of Systems       Negative except as per HPI   Vital Signs:  Patient profile:   65 year old male Height:      70 inches Weight:      175.50 pounds BMI:     25.27 Pulse rate:   55 / minute Pulse rhythm:   regular Resp:     18 per minute BP sitting:   100 / 70  (left arm) Cuff size:   large  Vitals Entered By: Vikki Ports (May 25, 2010 8:40 AM)  Physical Exam  General:  Pt is alert and  oriented, in no acute distress. HEENT: normal Neck: normal carotid upstrokes without bruits, JVP normal Lungs: CTA CV: RRR without murmur or gallop Abd: soft, NT, positive BS, no bruit, no organomegaly Ext: no clubbing, cyanosis, or edema. peripheral pulses 2+ and equal Skin: warm and dry without rash    EKG  Procedure date:  05/25/2010  Findings:      Sinus brady with LBBB  Impression & Recommendations:  Problem # 1:  CAD, ARTERY BYPASS GRAFT (ICD-414.04) Stable without angina. Pt with extensive CAD s/p CABG. Continue medical therapy.  His updated medication list for this problem includes:    Carvedilol 6.25 Mg Tabs (Carvedilol) .Marland Kitchen... Take one tablet two times a day    Aspirin Ec 325 Mg Tbec (Aspirin) .Marland Kitchen... Take one tablet by mouth daily  Orders: EKG w/ Interpretation (93000)  Problem # 2:  SYSTOLIC HEART FAILURE, CHRONIC (ICD-428.22) Secondary to severe underlying ischemic cardiomyopathy. Pt is stable without evidence of volume overload. He has chronic Class2-3 symptoms. Recommend increase coreg to 12.5 mg two times a day. Discussed cardiac resynchronization/ICD but he does not wish to pursue this at the present time.  His updated medication list for this problem includes:    Digoxin 0.125 Mg Tabs (Digoxin) .Marland Kitchen... Take 1 tablet by mouth once a day    Carvedilol 6.25 Mg Tabs (Carvedilol) .Marland Kitchen... Take one tablet  two times a day    Furosemide 80 Mg Tabs (Furosemide) .Marland Kitchen... Take one tablet by mouth daily    Aspirin Ec 325 Mg Tbec (Aspirin) .Marland Kitchen... Take one tablet by mouth daily    Diovan 40 Mg Tabs (Valsartan) .Marland Kitchen... Take one tablet by mouth twice a day  Orders: EKG w/ Interpretation (93000)  Problem # 3:  HYPERLIPIDEMIA-MIXED (ICD-272.4) Lipids at goal. Low HDL cholesterol noted.  His updated medication list for this problem includes:    Simvastatin 40 Mg Tabs (Simvastatin) .Marland Kitchen... Take 1 tablet by mouth once a day  Orders: EKG w/ Interpretation (93000)  CHOL: 111  (01/30/2010)   LDL: 67 (01/30/2010)   HDL: 22.20 (01/30/2010)   TG: 109.0 (01/30/2010)  Patient Instructions: 1)  Your physician has recommended you make the following change in your medication: INCREASE Carvedilol to 12.5mg  take one tablet by mouth two times a day in 1 MONTH (new Rx called into the pharmacy for next refill) 2)  Your physician wants you to follow-up in: 4 MONTHS.  You will receive a reminder letter in the mail two months in advance. If you don't receive a letter, please call our office to schedule the follow-up appointment.

## 2010-08-16 ENCOUNTER — Other Ambulatory Visit: Payer: Self-pay | Admitting: Cardiovascular Disease

## 2010-08-17 NOTE — Telephone Encounter (Signed)
[address]

## 2010-08-18 ENCOUNTER — Other Ambulatory Visit: Payer: Self-pay | Admitting: Cardiovascular Disease

## 2010-08-19 ENCOUNTER — Other Ambulatory Visit: Payer: Self-pay | Admitting: *Deleted

## 2010-08-19 MED ORDER — SIMVASTATIN 40 MG PO TABS: 40.0000 mg | ORAL_TABLET | Freq: Every evening | ORAL | Status: DC

## 2010-08-31 ENCOUNTER — Ambulatory Visit: Payer: Self-pay | Admitting: Cardiovascular Disease

## 2010-10-03 NOTE — Assessment & Plan Note (Signed)
OFFICE VISIT   Gittens, Alcee  DOB:  June 22, 1945                                        March 17, 2007  CHART #:  [id number]   The patient presents today for his follow up appointment status post  emergent coronary artery bypass grafting x4 with mitral valve repair  with a 30 mm Physio annuloplasty ring.  The patient had some pulmonary  issues postoperatively with respiratory insufficiency related to COPD  but eventually comes back and had a pretty much unremarkable  postoperative course.  He was discharged to home on postoperative day 8  in stable condition.  Postoperatively patient was started on Coumadin at  [facility] has been managing this.  Patient presents at  this follow up appointment complaining of a poor appetite which he  states has not changed since preoperatively.  He also complains of  shortness of breath with lying flat. He denies any chest pain, nausea,  vomiting, hemoptysis, dizziness, light headedness.  He has been  ambulating 3-4 times per day.  He continues to use his incentive  spirometer.  States Home Health nurses come out Monday, Wednesday and  Friday to draw PT/INR blood work, sending results to [facility].  He does inquire today about long term disability.   PHYSICAL EXAMINATION:  VITAL SIGNS:  Blood pressure 175/92, pulse is 95,  respirations 20, O2 saturation 99%.  RESPIRATORY:  Diminished breath sounds bilaterally, left greater than  right.  CARDIAC:  Regular rate and rhythm S1 and S2 noted.  ABDOMEN:  Bowel sounds x4. Soft, nontender.  EXTREMITIES:  Patient has 2+ pitting edema, right lower extremity.  INCISIONS:  All incisions are clean, dry and intact and healing well.   STUDIES:  The patient had chest x-ray today, March 17, 2007 showing  bilateral pleural effusions, left greater than right.   IMPRESSION AND PLAN:  Mr. Pinzon is status post emergent coronary artery  bypass grafting and mitral  valve repair.  Due to patient's left pleural  effusions and lower extremity edema, we will restart patient back on  Lasix as well as potassium to take 40 mg of Lasix daily and potassium 20  mEq daily.  The patient is told he needs to follow up with Dr. Excell Seltzer.  He needs to contact our office to make these arrangements.  He is to  continue taking all other medications he was taking at time of  discharge.  The patient is to continue to take Coumadin as directed.  He  is also told to continue ambulating 3-4 times per day and  working on his incentive spirometer.  Patient instructed to followup  with Dr. Reather Littler regarding any terms of long-term disability paper  work.  We will plan to follow back up with Mr. Lopezgarcia in 3 weeks with chest x-  ray for followup of his left pleural effusion.   Salvatore Decent Dorris Fetch, M.D.  Electronically Signed   KMD/MEDQ  D:  03/17/2007  T:  03/17/2007  Job:  [id number]

## 2010-10-03 NOTE — Assessment & Plan Note (Signed)
OFFICE VISIT   Woodside, Ritter  DOB:  [birth year]                                        April 04, 2007  CHART #:  [id number]   HISTORY OF PRESENT ILLNESS:  The patient returns to the office following  a previous appointment on March 17, 2007 at which time he was found to  have a moderate-sized left pleural effusion.  He is status post  emergency coronary artery bypass grafting x4 with mitral valve repair  using a 30-mm physio annuloplasty ring on February 24, 2007 for a ruptured  plaque in the left main, totally occluded right coronary with acute  evolving inferior myocardial infarction and early cardiogenic shock.  He  reports that he does have some ongoing dyspnea on exertion, but in  general he continues to feel as though he is getting stronger and is  making good progress.  The chest x-ray done on today's visit shows  significant improvement in the left pleural effusion with no new acute  findings.   PHYSICAL EXAMINATION:  Vital signs:  Blood pressure 116/78, pulse 88,  respirations 28, oxygen saturation 91%.  Lung sounds are clear to  auscultation.  Cardiac examination:  Regular rate and rhythm without  murmur.  Extremity examination:  Incisions are all healing well.  Improved edema.  Sternal incision also healing quite well without  evidence of infection.   ASSESSMENT:  Good resolution of his left pleural effusion following  emergent coronary revascularization and mitral valve repair.  We  reviewed his medications with him and he apparently has run out of his  Coreg and Vasotek and was rewritten for prescriptions as follows:  1. Coreg 3.125 mg p.o. b.i.d.  2. Vasotek 2.5 mg daily.  He is instructed to continue with his cardiac rehabilitation which he is  doing on his own.  He is not currently interested in a formal cardiac  rehabilitation program.  He is instructed that he can return to driving.  He is instructed to continue with his sternal  precautions and limit  weight lifting to 20 pounds.  He is instructed to continue with his  other medications as well as Coumadin  until he sees Dr. Excell Seltzer again in followup, and at this time he is not  felt to require further followup at the surgeon's office unless a new  issue presents.   Rowe Clack, P.A.-C.   Sherryll Burger  D:  04/04/2007  T:  04/05/2007  Job:  [id number]   cc:   [facility] office  Veverly Fells. Excell Seltzer, MD

## 2010-10-03 NOTE — Assessment & Plan Note (Signed)
[facility]                            CARDIOLOGY OFFICE NOTE   KWAMANE, WHACK                         MRN:          [id number]  DATE:07/28/2007                            DOB:          1945-10-13    [facility] returns for follow-up with the [facility] cardiology office on  July 28, 2007.  Nicholas Stanley is a 65 year old gentleman with coronary  artery disease and congestive heart failure as well as well as  underlying lung disease.  He presented an acute inferolateral infarct  back in October of 2008 and was ultimately taken for emergency  multivessel bypass surgery.  His initial presentation was complicated by  cardiogenic shock and incessant ventricular tachycardia.   Nicholas [facility] has improved since I last saw him in early February.  At that  time he had complained of recurrent episodes of right arm pain which is  his anginal equivalent.  Fortunately, has had no further symptoms in the  past month.  He really has been feeling better and denies chest pain,  arm pain, orthopnea, PND, edema, palpitations, lightheadedness or  syncope.  He has chronic dyspnea that is unchanged over time.  He has no  dyspnea at rest but he is quite limited in how much physical work he is  able to do.   CURRENT MEDICATIONS:  Potassium 20 mEq daily, Lasix 40 mg daily, aspirin  81 mg daily, Zocor 40 mg bedtime, enalapril 5 mg daily and Coreg 6.25 mg  twice daily.   ALLERGIES:  NKDA.   PHYSICAL EXAMINATION:  On exam the patient is alert and oriented.  He is  in no acute distress.  Weight 166, blood pressure 100/80, heart rate 76.  HEENT:  Normal.  NECK:  Normal carotid upstrokes without bruits.  LUNGS:  Clear bilaterally.  HEART:  Regular rate and rhythm without murmurs or gallops.  Heart  sounds are distant.  ABDOMEN:  Soft, nontender, no organomegaly.  EXTREMITIES:  No clubbing, cyanosis or edema.  Peripheral pulses are 2+  and equal throughout.   ASSESSMENT:  1.  Coronary artery disease,  status post myocardial infarction and      emergency coronary artery bypass graft.  Nicholas [facility] is improved with      no further angina.  Will continue his current medical therapy      without changes.  2. Ischemic cardiomyopathy.  Echocardiogram in December showed severe      left ventricular systolic dysfunction with an ejection fraction of      20-25%.  He was revascularized in October on an emergent basis as      noted above.  I would like to reassess his left ventricular      function in approximately 2 months when he comes back for his      follow-up visit so we can give his ventricle a chance at recovery.      If he has significant recovery of his LV function and continues to      have an LVEF under 30%, will plan on referral for ICD.  N[facility]  also has a left bundle branch block and congestive heart failure      and consideration may need to be made for cardiac      resynchronization.  It is difficult to sort out how severe his      heart failure symptoms are in the setting of longstanding tobacco      abuse and underlying lung disease.  3. Dyslipidemia.  He continues on simvastatin 40 mg.  Lipids from      December showed a total cholesterol of 110 with an HDL of 27 and an      LDL of 69.   For follow-up I will see Nicholas Stanley back in 2 months with an  echocardiogram as noted above.     Veverly Fells. Excell Seltzer, MD  Electronically Signed    MDC/MedQ  DD: 07/28/2007  DT: 07/29/2007  Job #: [id number]

## 2010-10-03 NOTE — Letter (Signed)
March 17, 2007   Veverly Fells. Excell Seltzer, MD  [address]  [place], [place] [address]   Re:  Nicholas Stanley, GRIDER                  DOB:  1946/03/08   Dear Kathlene November:   I saw [facility] back in the office today.  As you will recall, Mr.  Stanley is a 65 year old gentleman who is usually followed by Dr. Ivan Croft  at [facility] for his cardiac issues.  He presented to [facility] following a collapse with complete heart block with a ventricular  escaped rhythm.  During your emergency cardiac catheterization, he was  found to have a total occlusion of his right coronary as well as a  ruptured plaque in his left main.  He underwent emergent bypass grafting  as well as a mitral valve repair with an annuloplasty ring as he had  severe mitral regurgitation and severe left ventricular dysfunction.  This was on February 24, 2007.  He had a relatively slow postoperative  recovery, but did go home without any major complications on October 14.  Since that time, he has done fairly well from a medical standpoint.  He  has had some swelling in his legs and still does have some incisional  pain.  He has mild volume overload.  He only went home on Lasix for  about a week postoperatively and I think he probably needs to be on that  for a longer period of time, particularly given that we did a mitral  ring and he also some significant left ventricular dysfunction.  He  tells me that he has some severe financial issues and he is unable to  pay for that.  We are going to see if we can help him get his Medicaid  established which would provide him with assistance for his medications.   I am not sure who would be following him up long-term, whether it would  be you or his cardiologist at [facility].  He has questions about getting  permanent Disability.  From my standpoint and the standpoint of the  surgery, he certainly has short-term disability and cannot work for  about 3 months after the surgery.   Beyond that, it would really depend  on his cardiac function as well as his response to medications.   Please do not hesitate to contact me if I can be of any further  assistance with [facility] care.   Salvatore Decent Dorris Fetch, M.D.  Electronically Signed   SCH/MEDQ  D:  03/17/2007  T:  03/18/2007  Job:  [id number]

## 2010-10-03 NOTE — H&P (Signed)
NAMEJOHANN, SANTONE                ACCOUNT NO.:  [id number]   MEDICAL RECORD NO.:  [id number]          PATIENT TYPE:  INP   LOCATION:  2857                         FACILITY:  [facility]   PHYSICIAN:  Veverly Fells. Excell Seltzer, MD  DATE OF BIRTH:  05-22-1945   DATE OF ADMISSION:  02/24/2007  DATE OF DISCHARGE:                              HISTORY & PHYSICAL   Mr. Bala is a 65 year old Caucasian gentleman who is followed by Dr.  Minna Merritts at [facility] in [place], [place]  [place].  There are no previous notes in the [facility] system for Mr.  Noreen.  He is brought in emergently by EMS today.  Mr. Matlock was  apparently at work and found unresponsive.  EMS was notified.  There are  no EMS documentation available at this time.  The patient has been taken  urgently to the cardiac catheterization lab in the setting of a code  STEMI.   BRIEF HISTORY:  Mr. Conover has a known history of coronary artery disease  per old office note obtained from [facility].  He had  an acute ST-elevated anterior wall MI in July 2007; underwent PCI at  that time.  He also has a history of multivessel disease with prior  stenting of the mid LAD and mid left circumflex with progressive disease  within the proximal circumflex mid to distal right coronary artery and  total occlusion of the LAD artery at the time of his anterior MI in  2007.  There currently is no family available to confirm medical  history.  The patient apparently is not married at this time.  I have  been told he was working at a [employer] site this morning when he was  found unresponsive.  Per EMS verbal report, the patient was incomplete  heart block.  Temporary pacer was applied.  The patient currently has a  transvenous pacemaker placed in the cath lab by Dr. Calton Dach.  EKG  shows inferior lateral changes significant for a STEMI.   ALLERGIES:  The patient states no known drug allergies.   He is unable  to clarify any medications at this time.  Per previous  documentation from [facility], the patient's  medications as of January 14, 2007:  He was on:  1. Aspirin 81 mg daily.  2. Imdur 60 mg daily.  3. Metoprolol 25 mg b.i.d.  4. Enalapril 2.5 mg daily.  5. Lipitor 80 mg daily.  6. Plavix 75 mg daily.   Apparently the patient stopped his Tricor.  Changes were made in his  medication at that visit.  His Toprol was discontinued, and his  enalapril was increased to 5 mg b.i.d.   PAST MEDICAL HISTORY:  1. Non-ST elevated MI, July 2007.  PCI under this known multivessel      disease, previous stent to the mid LAD and mid left circumflex.      Progressive disease within the proximal circumflex, mid to distal      right coronary artery and total occlusion of the LAD at the time of  his anterior MI in 2007.  Status post Liberte non-drug-eluting      stent to the LAD at that time.  2. Dyslipidemia.  3. Ischemic cardiomyopathy with EF of 40% to 45% in 2007 with anterior      septal akinesis, global hypokinesis, anterior apical akinesis.  4. Ongoing tobacco use.  5. Hypertension.   SOCIAL HISTORY:  The patient apparently lives in [place].  Social  history is somewhat sketchy at this time, as the patient is unable to  verbally share information.  He apparently has an adult son.  He does  smoke, does some type of construction work.   FAMILY HISTORY:  Unknown at this time.   REVIEW OF SYSTEMS:  Unclear.  The patient is experiencing chills and  chest discomfort.   PHYSICAL EXAM:  Heart rate currently 64, respirations 22, blood pressure  78/49.  The patient is very groggy secondary to conscious sedation given during  catheterization.  Physical exam is somewhat limited.  NECK:  Without bruits.  No JVD noted.  CARDIOVASCULAR EXAM:  S1, S2.  Bradycardic.  Pulses 2+  and equal  without bruit.  Positive S4.  ABDOMEN:  Soft, nontender.  LUNGS:  Clear to auscultation  with poor inspiratory effort.  SKIN:  Cool to touch.  LOWER EXTREMITIES:  Without clubbing, cyanosis or edema.   Chest x-ray is pending.  EKG is pending.  EMS EKG shows acute ST  elevation in inferior leads.  Lab work is pending.  The patient is  currently being assessed by Dr. Tonny Bollman and Dr. Andrey Spearman  with CVTS status post STEMI.  Hypotensive at this time.  The patient  with complete heart block requiring a temporary pacing assistance.  The  patient has multivessel disease by cardiac catheterization.  Dr. Excell Seltzer  and Dr. Dorris Fetch both have spoken with the patient's sister,  Wylene Men,  who is going to be getting in touch with the patient's  son.  The patient needs emergent bypass, and this will be done by Dr.  Dorris Fetch.      Dorian Pod, ACNP      Veverly Fells. Excell Seltzer, MD  Electronically Signed    MB/MEDQ  D:  02/24/2007  T:  02/24/2007  Job:  [id number]

## 2010-10-03 NOTE — Assessment & Plan Note (Signed)
[facility]                            CARDIOLOGY OFFICE NOTE   Nicholas Stanley, ZAHLER                         MRN:          [id number]  DATE:07/21/2008                            DOB:          03/12/1946    REASON FOR VISIT:  Shortness of breath and edema.   HISTORY OF PRESENT ILLNESS:  Nicholas Stanley is a 65 year old gentleman with  advanced coronary artery disease and severe ischemic cardiomyopathy.  He  presents for evaluation of progressive shortness of breath and edema.  He he notes a several-week history of abdominal bloating, lower  extremity edema, and progressive shortness of breath.  He was recently  hospitalized at [facility] in January.  He was found to  have congestive heart failure at that point and was treated with  intravenous diuretics with improvement.  There were some adjustments  made in his medications.  After that hospitalization, he felt better for  a short time, but he is subsequently had progressive symptoms as  outlined above.  He describes shortness of breath with minimal activity.  He denies orthopnea or PND.  He has had some weight gain.  His main  complaint is abdominal fullness and bloating.  He feels like there is  fluid in his abdomen.  He also has leg swelling.  He has had no chest  pain, lightheadedness, palpitations, or syncope.   MEDICATIONS:  1. Potassium chloride 20 mEq daily.  2. Aspirin 81 mg daily.  3. Simvastatin 40 mg at bedtime.  4. Enalapril 5 mg daily.  5. Coreg 6.25 mg twice daily.  6. Furosemide 40 mg twice daily.   PAST MEDICAL HISTORY:  1. Coronary artery disease, status post multiple myocardial      infarctions.  2. Coronary artery bypass surgery, emergently done in 2008 in the      setting of acute myocardial infarction.  3. COPD.  4. Hypertension.  5. Hyperlipidemia.  6. Severe ischemic cardiomyopathy with LVEF less than 30%.  7. Longstanding tobacco abuse, quit since heart  attack in 2008.   SOCIAL HISTORY:  The patient lives alone.  He is disabled.  He is a  former tobacco user and does not drink alcohol.   REVIEW OF SYSTEMS:  Twelve point review of systems were performed.  Pertinent positives included cough with clear sputum production,  otherwise systems were negative except as outlined above.   PHYSICAL EXAMINATION:  GENERAL:  The patient is alert and oriented.  No  acute distress.  VITAL SIGNS:  Weight is 185 pounds, at his last visit his weight was 178  pounds on June 30, 2008.  His blood pressure 115/79, heart rate is  68, and respiratory rate is 20.  HEENT:  Normal.  NECK:  Normal carotid upstrokes.  No bruits.  JVP is elevated at 8-10  cm.  LUNGS:  Coarse bilateral breath sounds.  There are diffuse rhonchi.  No  crackles are appreciated.  CARDIOVASCULAR:  Heart sounds are distant.  The heart has regular rate  and rhythm.  There are no murmurs or gallops present.  ABDOMEN:  Soft, distended, and nontender.  Positive bowel sounds.  No  organomegaly.  Positive fluid wave.  BACK:  No CVA tenderness.  EXTREMITIES:  There is 2+ edema in the right pretibial region and 1+  edema on the left.  SKIN:  Warm and dry without rash.  Peripheral pulses are intact and  equal.   ASSESSMENT:  1. Acute-on-chronic systolic heart failure.  The patient has      decompensated heart failure.  I recommended hospital admission, but      the patient declined.  He would like to keep to this an attempt as      an outpatient.  I think it will be somewhat difficult to treat in      the setting of his severely depressed left ventricular function and      poor functional status, but we can give with a try.  I have      increased his Lasix to 80 mg twice daily and doubled his potassium      to 20 mEq twice daily.  He is advised to take an extra 80 mg of      Lasix this morning.  Digoxin 0.125 mg was added to his medical      regimen.  We are going to call him in 2 days  to see if he is      improving, if not were not push harder for hospitalization.  If he      is improving, I would like to see him back in 1 week for followup      where he will be rechecked and I will see at that time how he is      responding to increased diuretics.  A metabolic panel will be      checked as well.  2. Severe ischemic cardiomyopathy.  The patient has an upcoming EP      evaluation for consideration of implantable cardioverter-      defibrillator.  He has a brought QRS complex and may be a candidate      for a biventricular implantable cardioverter-defibrillator.  3. Dyslipidemia.  Continue simvastatin.  We will repeat lipids and      LFTs in the near future.  His panel from September 2009, showed a      cholesterol of 88, HDL 26, and LDL 52.   For followup, I will see [facility] next week.  We will contact him by  phone to see how he is progressing in few days.     Nich[facility]. Nicholas Seltzer, MD     MDC/MedQ  DD: 07/21/2008  DT: 07/22/2008  Job #: [id number]

## 2010-10-03 NOTE — Op Note (Signed)
NAMENAKOTA, ELSEN                ACCOUNT NO.:  [id number]   MEDICAL RECORD NO.:  [id number]          PATIENT TYPE:  INP   LOCATION:  2304                         FACILITY:  [facility]   PHYSICIAN:  Salvatore Decent. Dorris Fetch, M.D.DATE OF BIRTH:  1945/09/10   DATE OF PROCEDURE:  02/24/2007  DATE OF DISCHARGE:                               OPERATIVE REPORT   PREOPERATIVE DIAGNOSES:  1. Ruptured plaque left main.  2. Totally occluded right coronary.  3. Acute evolving inferior myocardial infarction.  4. Early cardiogenic shock.   POSTOPERATIVE DIAGNOSES:  1. Ruptured plaque left main.  2. Totally occluded right coronary.  3. Acute evolving inferior myocardial infarction.  4. Early cardiogenic shock.  5. Severe mitral regurgitation.   PROCEDURE:  Median sternotomy, extracorporeal circulation, emergency  coronary artery bypass grafting x4 (left internal mammary artery to LAD,  saphenous vein graft to first diagonal, saphenous vein graft to obtuse  marginal 1, saphenous vein graft to posterior descending), mitral valve  repair with 30 mm Physio angioplasty ring, endoscopic vein harvest right  leg.   SURGEON:  Salvatore Decent. Dorris Fetch, M.D.   ASSISTANT:  Rowe Clack, P.A.-C.   ANESTHESIA:  General.   FINDINGS:  Diffusely diseased coronaries, significant inferior scar,  emphysema.  Transesophageal echocardiography revealed severe left  ventricular dysfunction, annular dilatation, and severe mitral  regurgitation prebypass, no residual mitral regurgitation post bypass.   CLINICAL NOTE:  Mr. Joos is a 65 year old gentleman who collapsed at  work early this morning.  He was found by EMS to be in complete heart  block with a ventricular escape at 40 beats per minute.  Transcutaneous  pacing was initiated, and the patient was brought emergently to the  cardiac catheterization laboratory, where he underwent emergent cardiac  catheterization by Dr. Tonny Bollman.  This revealed total  occlusion of  the right coronary artery and a ruptured plaque in the left main.  There  were stents in place and the LAD, diagonal, and obtuse marginal from a  previous procedure.  The patient had severe left ventricular  dysfunction, with inferior akinesis and mitral regurgitation.  The  patient was advised to undergo emergency coronary bypass grafting,  albeit at extremely high risk.  The patient wished to do whatever it  takes, accepted the risks of surgery, and agreed to proceed.   OPERATIVE NOTE:  Mr. Behney was brought from the catheterization lab to  the operating room on February 24, 2007 emergently.  The patient had two  episodes of ventricular tachycardia requiring cardioversion.  General  anesthesia was induced. Anesthesia placed lines for monitoring arterial  pressure as well as a Swan-Ganz catheter.  An intraaortic balloon pump  had been placed in the cardiac catheterization laboratory.  Transesophageal echocardiography was performed as the patient was being  prepped and draped in the usual sterile fashion.  It revealed severe  mitral regurgitation.  There was severe left ventricular dysfunction,  with hypokinesis of the anterior and septal walls and akinesis of the  inferior wall and posterior wall.  There was annular dilatation, with a  central mitral regurgitation jet.  There was no evidence of papillary  muscle rupture.  A median sternotomy was performed.  At the same time,  an incision was made in the medial aspect of the right leg, and the  greater saphenous vein was harvested endoscopically from the right leg.  It was of satisfactory quality.  The patient was fully heparinized after  performing the median sternotomy.  The pericardium was opened.  The  ascending aorta was cannulated via concentric 2-0 Ethibond pledgeted  pursestring sutures.  After confirming adequate anticoagulation with ACT  measurement, the pursestring suture was placed in the superior vena cava  in  preparation for dual venous cannulation.  It was a very difficult  exposure due to the patient's body habitus and markedly enlarged heart  and elevated right-sided pressures.  A 31-French right-angle cannula was  placed via the pursestring suture in the right-sided superior vena cava.  However, the suture was cut when incising the superior vena cava.  The  cannula was held in place.  An attempt was made to suture around the  cannula, but the cannula became dislodged.  This resulted in massive  bleeding.  A dual-stage venous cannula placed via the right atrial  appendage, connected to he bypass circuit, and cardiopulmonary bypass  was commenced.  Pursestring suture then was placed back in the superior  vena cava, and the 31-French right-angle metal tip cannula was placed  via this pursestring suture into the superior vena cava and secured with  a Rumel tourniquet.  This was then hooked to the circuit.  The dual-  stage cannula was removed from the right atrium, and a separate 40-  French right-angle cannula was placed via pursestring in the inferior  aspect of the right atrium and directed into the inferior vena cava.  Full bypass was commenced.  The coronary arteries were inspected, and  anastomotic sites were chosen.  Of note, coronaries were very thickened,  with inflammatory changes in the epicardial surface above the  coronaries, which made dissection of the vessels difficult.  Coronary  arteries were large in size but diffusely diseased.   The left internal mammary artery then was taken down using the Rultract  retractor for exposure.  It was an adequate vessel for use as a graft  conduit.  The vein was prepared and cut to length.  As noted, it was  satisfactory.   A retrograde cardioplegic cannula placed via a pursestring suture in the  right atrium and directed in the coronary sinus.  An antegrade  cardioplegic cannula was placed in the ascending aorta.  Foam pad was  placed in  the pericardium to protect the left phrenic nerve and insulate  the heart.  A temperature probe was placed in the myocardial septum.  The patient was cooled to 28 degrees Celsius.   The aorta was cross-clamped.  The left ventricle was emptied via the  aortic root vent.  Cardiac arrest was achieved with a combination of  cold antegrade and retrograde blood cardioplegia and topical iced  saline.  Antegrade cardioplegia was administered initially to achieve a  diastolic arrest, and then the remainder of the cardioplegia was given  in retrograde fashion.  Myocardial septal cooling to 11 degrees Celsius  was accomplished.  The following distal anastomoses were performed.   First, a reverse saphenous vein grafts placed end-to-side to the  posterior descending branch of the right coronary.  This was a 2 mm  diffusely diseased fair-quality target.  The vein graft was anastomosed  end-to-side with a running 7-0 Prolene suture.  There was good flow  through the graft.  At the completion of each graft, it was probed  proximally and distally, and then the vein grafts were infused with cold  blood cardioplegia.  There was good hemostasis at the anastomosis.   Next, a reverse saphenous vein graft placed end-to-side to the first  diagonal branch of the LAD.  This was a large anterolateral branch.  It  had previously been stented.  It also was diffusely diseased, thick  walled, but was graftable.  The vein graft was anastomosed end-to-side  with a running 7-0 Prolene suture.   Next, a reverse saphenous vein graft was placed to the dominant lateral  branch which was an obtuse marginal vessel which had previously been  stented more proximally.  This also was a 2 mm vessel.  It was  intramyocardial.  It was diffusely diseased, thickened, and only of  adequate quality.  The vein graft was anastomosed end-to-side with a  running 7-0 Prolene suture.  Additional cardioplegia then was  administered down the  vein grafts and the retrograde cardioplegic  cannula.  The left internal mammary artery was brought through a window  in the pericardium.  The distal end was beveled and was anastomosed end-  to-side to the distal LAD.  The LAD was a 2mm diffusely diseased,  thickened vessel.  The mammary was satisfactory.  The anastomosis was  performed with a running 7-0 Prolene suture.  At the completion of the  mammary-to-LAD anastomosis, the bulldog clamps were briefly removed to  inspect for hemostasis.  Septal rewarming was noted.  The bulldog clamp  was replaced.  The mammary pedicle was tacked to the epicardial surface  of the heart with 6-0 Prolene sutures.   Additional cardioplegia was administered down the retrograde cannula.  An incision was made in the right atrium after securing the caval tapes.  The fossa ovalis was identified and incised, exposing the mitral valve  within the left atrium.  The anterior portion of the annulus was nearly  directly adjacent to the fossa ovalis, and there was limited exposure  anteriorly, but the anterior annulus was identifiable.  The leaflets  were inspected.  There was no prolapse.  The anterior leaflet sized for  a 30 mm Carpentier-Edwards Physio annuloplasty ring.  2-0 Ethibond  horizontal mattress sutures were placed circumferentially around the  annulus .  A total of 13 sutures were used. The commissural distance  sized also for a 30 mm Carpentier-Edwards Physio ring.  The sutures then  were placed through the sewing ring of the annuloplasty ring.  The ring  was lowered into place, and the sutures were sequentially tied.  The  left ventricle then was distended with iced saline, and the valve was  competent. Additional cardioplegia was administered at 20-minute  intervals, and myocardial septal temperature was maintained at less than  15 degrees Celsius during this portion of the procedure.  The catheter  was left across the mitral valve into the left  ventricle.  The left  atrium then was closed with two layers of running 4-0 Prolene suture,  the first a horizontal mattress and the second a running simple suture.  The catheter was removed after performing de-airing maneuvers and tying  the suture.  The right atriotomy then was closed again in two layers  with running 4-0 Prolene sutures.  Again, the right atrium was de-aired  before completing the suture line.   Additional  cardioplegia was administered down the vein grafts.  They  were cut to length.  The diagonal vein was relatively small for direct  anastomoses to the aortic, particularly with the aorta being thickened.  Therefore, the cardioplegic cannula was removed, and the proximal  anastomoses for the obtuse marginal and posterior descending grafts were  performed to 4.5 mm punch aortotomies with running 6-0 Prolene sutures.  At the completion of the second proximal anastomosis, the patient is  placed in Trendelenburg position.  A warm dose of retrograde  cardioplegia was administered.  Lidocaine was administered.  The bulldog  clamp was then removed from the left mammary artery.  De-airing was  performed, and the aortic crossclamp was removed.  The total crossclamp  time was 159 minutes.  Bulldog clamps were placed proximally and  distally on the obtuse marginal vein graft.  A longitudinal venotomy was  performed, and the proximal anastomosis for the diagonal vein graft was  done in an end-to-side fashion with a running 7-0 Prolene suture.  The  anastomosis was de-aired, and the bulldog clamps were removed.  The  patient spontaneously resumed a sinus rhythm.  All proximal and distal  anastomoses as well as the atrial incisions were inspect for hemostasis.  While the patient was being rewarmed, epicardial pacing wires were  placed on the right ventricle and right atrium.  The patient was loaded  with milrinone, and a low-dose infusion was initiated at 0.3  mcg/kg/minute.   Dopamine infusion at 5 mcg/kg/minute was initiated as  well.  When the had patient rewarmed to a core temperature of 37 degrees  Celsius, he was weaned from cardiopulmonary bypass on the first attempt  without difficulty.  The total bypass time was 277 minutes.  The patient  was thrombocytopenic, in addition to be on Plavix preoperatively.  Two  bags of platelets were administered.  The patient also did require blood  transfusions during the bypass run.  The initial cardiac index was  greater than 2 liters/minute/sq m, and the patient remained  hemodynamically stable throughout post-bypass period on dopamine,  milrinone, and the intraaortic balloon pump at 1:1.  He did not require  pacing at any time during the post-bypass period.  A test dose of  protamine was administered.  The superior vena caval cannula and aortic  cannulae were removed.  The remainder of the protamine was administered  without incident.  Platelets were administered after the protamine was  completed.  The chest was irrigated with 1 liter of warm normal saline  containing 1 g of vancomycin.  Hemostasis was achieved.  A left pleural  and two mediastinal chest tubes were placed through separate subcostal  incisions.  The sternum was closed with a combination of single and  double heavy-gauge stainless steel wires.  Pectoralis fascia,  subcutaneous tissue, and skin were closed in standard fashion.  All  sponge, needle, and instrument counts were correct at the end of the  procedure.  The patient was taken from the operating room to the  surgical intensive care unit in critical condition.      Salvatore Decent Dorris Fetch, M.D.  Electronically Signed     SCH/MEDQ  D:  02/24/2007  T:  02/25/2007  Job:  [id number]   cc:   Veverly Fells. Excell Seltzer, MD

## 2010-10-03 NOTE — Assessment & Plan Note (Signed)
[facility]                            CARDIOLOGY OFFICE NOTE   JOSHVA, LABRECK                         MRN:          [id number]  DATE:03/26/2007                            DOB:          05/14/1946    Raye Sorrow was seen in followup at the [facility] office on  March 26, 2007.  Mr. Bonsell was hospitalized from October 6 through  October 14 with an inferolateral MI.  He is a 65 year old gentleman who  presented in cardiogenic shock with both hemodynamic and cardiac rhythm  instability in the setting of a large acute inferolateral MI.  At  catheterization he was found to have an acutely occluded right coronary  artery as well as a dissection of his left mainstem.  He underwent  placement of a temporary pacemaker and intra-aortic balloon pump.  Dr.  Dorris Fetch was consulted and he underwent emergent revascularization  with multivessel bypass.  He had slow recovery following surgery but  overall did quite well considering the circumstances.  He presents today  for followup.  Of note, he also had a mitral ring done for severe mitral  regurgitation.   Mr. Hoon has been doing relatively well from a symptomatic standpoint.  He has not had any exertional symptoms.  He has had some chest pain with  certain movements more indicative of postoperative pain.  He denies  exertional chest pain or pressure.  He has not had shortness of breath.  He had some lower extremity edema but this improved after resuming  Lasix.  He is under a lot of financial strain and is concerned about his  ability to return to work in the setting of his severe heart disease.   CURRENT MEDICATIONS:  1. Potassium chloride 20 mEq daily.  2. Lasix 40 mg daily.  3. Enalapril 5 mg twice daily.  4. Aspirin 81 mg daily.  5. Coumadin as directed.  6. Carvedilol 3.125 mg twice daily.   EXAMINATION:  The patient is alert and oriented, he is in no acute  distress.  Weight is 173  pounds, blood pressure 116/78, heart rate 75,  respiratory rate 16.  HEENT:  Normal.  NECK:  Normal carotid upstrokes without bruits.  Jugular venous pressure  is normal.  LUNGS:  There are scattered rhonchi but otherwise clear.  CARDIOVASCULAR:  The heart is regular rate and rhythm, distant heart  sounds are present.  There are no murmurs or gallops present.  ABDOMEN:  Soft, nontender, no organomegaly, no abdominal bruits.  EXTREMITIES:  There is no clubbing, cyanosis, or edema.  Peripheral  pulses are 2+ and equal throughout.   EKG shows normal sinus rhythm with an inferior MI pattern, age  indeterminate.  There is also poor R wave progression across the  precordium and a nonspecific lateral T-wave abnormality.   ASSESSMENT:  1. Coronary artery disease with prior stenting of the left anterior      descending artery and recent inferolateral myocardial infarction,      as well as left main stenosis with dissection.  Mr. Stead has done  remarkably well with emergency bypass surgery.  He seems to be on      the road to recovery.  I have asked him to continue his current      medical regimen at present.  I did add simvastatin 40 mg to his      daily medical regimen as he clearly needs statin therapy in the      setting of his advanced CAD.  Dr. Dorris Fetch wanted Mr. Veron to      remain on Coumadin for 6 weeks following his mitral ring.  He is      currently 4 weeks out from surgery and we have extended his home      health orders so that he can have PT/INR drawn for the next couple      of weeks.  At that point he should he be okay to discontinue      Coumadin.  I would like to follow him up closely in 1 month to make      sure that he is continuing good progress postoperatively.  2. Post myocardial infarction left ventricular dysfunction.  At his      return visit next month we are going to check a 2-D echocardiogram      to reassess his LV function.  3. Dyslipidemia.  As above,  simvastatin was initiated.  Will follow up      lipids and LFTs at the time of his next office visit.   Regarding his return to work, he is going to need to be out of work for  3 months following bypass.  I will plan on reassessing his status after  3 months to see if he will require more long-term disability or if he is  capable of returning to work at that point.  I congratulated him on his  ability to discontinue cigarettes.  He is also taking his medication as  directed.  We will follow up with him in 1 month as detailed above.     Veverly Fells. Excell Seltzer, MD  Electronically Signed    MDC/MedQ  DD: 03/26/2007  DT: 03/27/2007  Job #: [id number]   cc:   Salvatore Decent. Dorris Fetch, M.D.

## 2010-10-03 NOTE — Discharge Summary (Signed)
NAMEMCKOY, BHAKTA NO.:  [id number]   MEDICAL RECORD NO.:  [id number]          PATIENT TYPE:  INP   LOCATION:  2032                         FACILITY:  [facility]   PHYSICIAN:  Salvatore Decent. Dorris Fetch, M.D.DATE OF BIRTH:  28-Sep-1945   DATE OF ADMISSION:  02/24/2007  DATE OF DISCHARGE:  03/04/2007                               DISCHARGE SUMMARY   HISTORY OF PRESENT ILLNESS:  Mr. Teed is a 65 year old white male  followed by Dr. Ivan Croft at [facility].  He presented to the  hospital emergently by EMS on the date of admission.  He was found at  work, unresponsive and EMS was notified.  The patient was taken urgently  to the cardiac catheterization lab in the setting of code STEMI.  A  brief history for Mr. Cueto is that he has known coronary artery  disease.  He had an acute ST elevation anterior wall myocardial  infarction in July of 2007 and underwent PCI at that time.  He has a  history of multi-vessel disease with prior stenting of the mid LAD, mid  left circumflex with progressive disease within the proximal circumflex,  mid to distal right coronary artery and total occlusion in the LAD at  the time of his anterior myocardial infarction in 2007.  There was no  family available to confirm the medical history.  This history was  gleaned from a note from [facility].  The patient was  found by EMS to be in complete heart block and a temporary pacer was  applied.  EKG findings by EMS showed lateral changes significant for ST  elevation myocardial infarction.  He was brought urgently to the cardiac  catheterization lab for further diagnostic evaluation.   ALLERGIES:  No known drug allergies.   MEDICATIONS:  Prior to admission from previous documentation from  [facility] included:  1. Aspirin 81 mg daily.  2. Imdur 60 mg daily.  3. Metoprolol 25 mg b.i.d.  4. Enalapril 2.5 mg daily.  5. Lipitor 80 mg daily.  6. Plavix 75 mg  daily.   Additionally reported medication changes were made at an office visit on  January 14, 2007 where his Toprol was discontinued and his Enalapril was  increased to 5 mg b.i.d.   PAST MEDICAL HISTORY:  Includes:  1. Non-ST elevation myocardial infarction in July of 2007.  2. Previous PCIs as described above.   Other diagnoses include:  1. Dyslipidemia.  2. Ischemic cardiomyopathy with an ejection fraction of 40% to 45% in      2007 with anterior septal akinesis, global hypokinesis and anterior      apical akinesis.  3. Ongoing tobacco use.  4. Hypertension.   FAMILY HISTORY/SOCIAL HISTORY/REVIEW OF SYSTEMS AND PHYSICAL EXAM:  Please see the history and physical done at the time of admission.   HOSPITAL COURSE:  The patient was admitted through the emergency room in  code STEMI and cardiac catheterization was done promptly.  Findings on  this study revealed the ostium and proximal portion of the left mainstem  show an area that is suggestive  of plaque rupture.  There is an extra  luminal dye stain.  There appears to be TIMI 3 flow in the LAD and left  circumflex.  The distal left main has minor stenosis in the range of 20  to 30%.  The LAD had a long segment of stents in the proximal and mid  portion.  There also appeared to be a stent in the first diagonal  branch.  From the limited images, the LAD is patent and there appeared  to be nonobstructive disease throughout.  The diagonal branch was also  patent.  The left circumflex had a long segment of stent  in its mid  portion that appeared to be patent.  There was TIMI 3 flow in that  vessel.  There appeared to be some moderate disease off the proximal  aspect of the stent and there is suggestion of a filling defect also in  the proximal portion of the stent.  The right coronary artery was  severely disease.  The proximal portion had an ulcerated plaque with an  approximate 50% stenosis.  The vessel was ectatic throughout, and  the  distal portion of the right coronary artery is totally occluded.  There  was heavy calcification throughout the right coronary artery.  Left  ventriculography demonstrated severe segmental left ventricular systolic  dysfunction with a large area of inferior wall akinesis and  anterolateral as well as apical hypokinesis and the left ventricular  ejection fraction was estimated at 25%.  Thoracic aortogram revealed  descending thoracic aortogram demonstrates diffuse irregularity and mild  tortuosity but a reasonable aorta to place an intraaortic balloon pump.  The left renal artery was well visualized and patent.  The right renal  artery was not as well seen by appeared patent.  Assessment of this  study revealed the following:  1. Acute inferoposterior myocardial infarction secondary to an      occluded right coronary artery.  2. Cardiogenic shock.  3. Complete heart block treated with a temporary transient pacemaker.  4. Ruptured plaque in the left mainstem with extraluminal dye stain.  5. Patent left anterior descending and left circumflex stents.  6. Severe segmental left ventricular systolic dysfunction with an      overall left ventricular ejection fraction in the range of 20% to      30%.  Due to these findings, a cardiothoracic surgical consultation      was obtained with Charlett Lango, M.D. emergently.  Dr.      Dorris Fetch evaluated the patient in the catheterization lab.  The      patient was then transported in critical condition to the operating      room for emergent surgical revascularization.   PROCEDURE:  On February 24, 2007, the patient was taken emergently to the  cardiac operating suite where he underwent the following procedure:  Emergency coronary artery bypass graft times 4.  The following grafts  were placed:  1. Left internal mammary artery to the LAD.  2. Saphenous vein graft to the first diagonal.  3. Saphenous vein graft to the obtuse marginal #1 and  finally, a      saphenous vein graft to the posterior descending.   Additionally, a mitral valve repair with a 30 mm Physio annuloplasty  ring was done.  Findings showed diffusely diseased coronaries with  significant inferior scar.  Additionally, findings in the lung were  consistent with significant emphysema.  Transesophageal echocardiogram  revealed severe left ventricular dysfunction with annual diltation  and  severe mitral regurgitation prebypass with no residual mitral  regurgitation post bypass.   POSTOPERATIVE HOSPITAL COURSE:  The patient has done well  postoperatively.  Additionally, he continued the intraaortic balloon  pump as well as anatrophic support but these were able to be weaned  without significant difficulty.  Additionally, he was weaned from the  ventilator without significant difficulty.  He has required aggressive  pulmonary management for postoperative respiratory insufficiency related  to his COPD.  He has responded to this well, although he did have an  episode requiring return to the surgical intensive care unit, due to  oversedation and respiratory compromise.  He has rebounded from this  very well.  Hemodynamically he is stable.  He has had no significant  cardiac dysrhythmias or ectopy.  He has been started on Coreg.  He  appears to be tolerating this from a pulmonary viewpoint as well as from  a cardiac viewpoint.  He has been started on Coumadin which will be  continued for 6 weeks for the placement of the mitral valve annuloplasty  ring.  His laboratories have revealed a moderate postoperative acute  blood loss anemia but this is stable.  His most recent hemoglobin and  hematocrit dated March 01, 2007 are 9.6 and 28.6 respectively.  His  most recent INR dated March 04, 2007 is 2.3.  Electrolytes, BUN and  creatinine are all within normal limits.  Most recent BUN and creatinine  dated March 04, 2007 are 27 and 1.36 respectively.  He is  tolerating  cardiac rehabilitation phase I modalities using standard protocols.  His  incisions are healing well without evidence of infection.  He does have  a moderate volume overload and will require further diuresis as an  outpatient.  His oxygen has been weaned and he maintains good  saturations on room air.  He is afebrile.  His overall status is felt to  be acceptable for discharge on March 04, 2007.   MEDICATIONS AT TIME OF DISCHARGE:  Include the following:  1. Aspirin 81 mg daily.  2. Coreg 3.125 mg twice daily.  3. Vasotek 2.5 mg daily.  4. Lipitor 80 mg daily.  5. Coumadin 2.5 mg q.h.s. as directed for 6 weeks [facility].  6. Lasix 40 mg daily for 7 days.  7. K-Dur 20 mEq daily for 7 days.  8. Combivent meter dose inhaler 2 puffs 4 times daily for pain.  9. Oxycodone 5 mg 1 to 2 every 4-6 hours as needed.   INSTRUCTIONS:  The patient will receive written instructions regarding  medications, activity, diet, wound care, and followup.  Followup will  include Dr. Excell Seltzer in 2 weeks, Dr. Dorris Fetch on March 17, 2007 at 1  p.m.  A home health nurse has been arranged to call the results of the  Coumadin to the [facility].  The first one will be drawn on  March 06, 2007.  Goal INRs in the range of 2.0 to 2.5 for the  annuloplasty ring again, as stated for 6 weeks.   FINAL DIAGNOSIS:  Status post emergency coronary artery bypass graft and  mitral valve repair as described above for acute left main coronary  plaque rupture and inferior myocardial infarction.   OTHER DIAGNOSES:  Include:  1. Postoperative volume overload.  2. Postoperative anticoagulation therapy for mitral valve ring.  3. Chronic obstructive pulmonary disease.  4. Previous coronary artery disease and stenting as described above.  5. Ischemic cardiomyopathy with ejection fraction of 25%.  6. Acute respiratory insufficiency, status post the surgical procedure      which has  stabilized and is improving.  7. Postoperative thrombocytopenia improved and stabilized.  Most      recent platelet count March 01, 2007 is 117,000.  8. Postoperative hypokalemia corrected with supplementation.  9. Dyslipidemia.  10.History of ongoing tobacco use.  11.Hypertension.      Rowe Clack, P.A.-C.      Salvatore Decent Dorris Fetch, M.D.  Electronically Signed    WEG/MEDQ  D:  03/04/2007  T:  03/04/2007  Job:  [id number]   cc:   Veverly Fells. Excell Seltzer, MD  Minna Merritts, M.D.  Salvatore Decent Dorris Fetch, M.D.

## 2010-10-03 NOTE — Assessment & Plan Note (Signed)
[facility]                            CARDIOLOGY OFFICE NOTE   Nicholas Stanley                         MRN:          [id number]  DATE:09/29/2007                            DOB:          01-Mar-1946    Nicholas Stanley returns for follow-up with the [facility] cardiology office on  Sep 29, 2007.  He is a 65 year old gentleman with coronary artery  disease, multiple myocardial infarctions, and New York Heart Association  class III CHF secondary to severe ischemic cardiomyopathy.   Nicholas Stanley continues to have mild to moderate symptomatic limitation from  his heart failure.  He complains of dyspnea and fatigue with walking.  He does not have rest symptoms.  He denies edema, chest pain, orthopnea  or PND.   He underwent a repeat echocardiogram to follow up his LV recovery after  his large myocardial infarction in October 2008.  His echo has not been  officially interpreted, but by my review his EF is markedly reduced and  is less than 30%.   MEDICATIONS:  1. Potassium 20 mEq daily.  2. Lasix 40 mg daily.  3. Aspirin 81 mg daily.  4. Simvastatin 40 mg at bedtime.  5. Enalapril 5 mg daily.  6. Coreg 6.25 mg twice daily.   ALLERGIES:  NKDA.   PHYSICAL EXAMINATION:  GENERAL:  The patient is alert and oriented.  He  is in no acute distress.  He is chronically ill-appearing.  VITAL SIGNS:  Weight 169, blood pressure 106/80, heart rate 68,  respiratory rate 16.  HEENT:  Normal.  NECK:  Normal, carotid upstrokes without bruits.  Jugular venous  pressure is normal.  LUNGS:  Have prolonged expiration and reduced air movement throughout.  HEART:  Regular rate and rhythm without murmurs or gallops.  ABDOMEN:  Soft, nontender, no organomegaly.  There is a midline  abdominal hernia.  EXTREMITIES:  No clubbing, cyanosis or edema.  SKIN:  Warm and dry without rash.   EKG shows sinus rhythm with left bundle branch block, QRS duration 142  milliseconds, question  age indeterminate inferior MI.   ASSESSMENT:  1. Chronic systolic heart failure New York Heart Association class      III.  Nicholas Stanley symptoms are likely related to a combination of      cardiac and pulmonary disease.  He continues to smoke cigarettes,      but has cut way back.  I am going to continue his current medical      regimen for now without changes.  He has had some trouble with      hypotension, but his blood pressure seems to have stabilized over      his last few office visits.  I am not sure that he will tolerate      higher doses of Coreg based on his lung problems.  After reviewing      his echocardiogram and seeing that his LV function is severely      depressed with large areas of prior infarct throughout the      inferoposterior wall, I think his  risk of sudden cardiac death is      significant.  I have reviewed this with him in the past, but now he      is over six months out from his most recent myocardial infarction.      I am going to refer him for an electrophysiology evaluation for      consideration of a biventricular ICD in the setting of his left      bundle branch block and left ventricle ejection fraction less than      30.  2. Coronary artery disease.  As above continue with current medical      therapy.  3. Dyslipidemia.  He remains on simvastatin.  Lipids from December      2008 showed a cholesterol 110, triglycerides 74, HDL 27, LDL 69.   For follow-up I would like to see Nicholas Stanley back in three months.  No  changes were made to his medical regimen today.     Veverly Fells. Excell Seltzer, MD  Electronically Signed    MDC/MedQ  DD: 09/29/2007  DT: 09/29/2007  Job #: [id number]

## 2010-10-03 NOTE — Assessment & Plan Note (Signed)
Nicholas Stanley                            CARDIOLOGY OFFICE NOTE   Nicholas Stanley                         MRN:          [id number]  DATE:06/30/2008                            DOB:          [birth year]    PRIMARY CARDIOLOGIST:  Veverly Fells. Excell Seltzer, MD   Nicholas Stanley is a very pleasant 65 year old white male who underwent  emergency CABG in the setting of a large inferoposterior MI  approximately 1 year ago.  He had mitral valve repair at that time and  his ejection fraction remains severely depressed at 20% with akinesis of  the entire inferoposterior wall and inferior septum.   He was just hospitalized at [facility] for 3 days at  the end of January for congestive heart failure.  We are trying to  obtain these records.  He states that he had a lot of fluid buildup,  they gave him IV Lasix and has sent him home on doubled his Lasix dose.  He is feeling much better.  Dr. Excell Seltzer had talked to him about possible  defibrillator and he wanted to speak to his sister and uncle who both  have a defibrillator.  He has decided that he wants to proceed with  this.  Today, he is stable without chest pain, shortness of breath,  dyspnea on exertion, dizziness, or presyncope.  He has quit smoking.   CURRENT MEDICATIONS:  1. Lasix 40 mg b.i.d.  2. Coreg 6.25 mg b.i.d.  3. Enalapril 5 mg daily.  4. Simvastatin 40 mg nightly.  5. Aspirin 81 mg daily.  6. Potassium 20 mEq daily.   PHYSICAL EXAMINATION:  GENERAL:  This is a pleasant 65 year old white  male in no acute distress.  VITAL SIGNS:  Blood pressures 107/77, pulse 76, and weight 178.  NECK:  Without JVD, HJR, bruit, or thyroid enlargement.  LUNGS:  Clear anterior, posterior, and lateral.  HEART:  Regular rate and rhythm at 76 beats per minute.  Normal S1 and  S2 with 1/6 systolic murmur at the left sternal border.  ABDOMEN:  Soft  without organomegaly, masses, lesions, or abnormal tenderness.   He does  have a small ventral hernia.  EXTREMITIES:  He has ankle edema on the  right.  No edema on the left.  Positive distal pulses.   EKG, normal sinus rhythm and left bundle branch block.   IMPRESSION:  1. Acute systolic heart failure requiring hospitalization at [facility], resolved.  2. Ischemic cardiomyopathy, ejection fraction 20% with akinesis of the      entire inferoposterior wall and inferior septum.  3. Coronary artery disease, status post emergency coronary artery      bypass graft in the setting of a large inferior posterior wall      myocardial infarction approximately 1 year ago.  The left internal      mammary artery to the left anterior descending, saphenous vein      graft to the first diagonal, saphenous vein graft to the obtuse      marginal 1, and saphenous  vein graft to the posterior descending      (coronary) artery.  4. Chronic obstructive pulmonary disease, quit smoking 2 months ago.  5. Mitral valve ring at the time of bypass surgery.  6. Dyslipidemia.   PLAN:  The patient's heart failure is stable at this time.  We are  trying to obtain records from his hospitalization at [facility].  We will schedule an appointment for him to see Dr. Graciela Husbands or Dr. Ladona Ridgel  for evaluation of possible defibrillator and he already has an  appointment to see Dr. Excell Seltzer, back on July 25, 2008.      Jacolyn Reedy, PA-C  Electronically Signed      Madolyn Frieze. Jens Som, MD, [facility]  Electronically Signed   ML/MedQ  DD: 06/30/2008  DT: 06/30/2008  Job #: [id number]

## 2010-10-03 NOTE — Assessment & Plan Note (Signed)
[facility]                            CARDIOLOGY OFFICE NOTE   Nicholas Stanley, Nicholas Stanley                         MRN:          [id number]  DATE:05/03/2008                            DOB:          1945/11/30    REASON FOR VISIT:  CAD and CHF.   HISTORY OF PRESENT ILLNESS:  Nicholas Stanley is a 65 year old gentleman with  severe multivessel CAD and multiple prior myocardial infarctions with  New York Heart Association class III CHF symptoms.  He has severe  underlying ischemic cardiomyopathy as well as COPD from long-standing  heavy tobacco abuse.  He underwent emergency coronary bypass surgery in  the setting of a large inferoposterior MI just over 1 year ago.  He had  mitral valve repair at that time as well.  His LVEF remains severely  depressed at 20% with akinesis of the entire inferoposterior wall and  inferior septum.   From a symptomatic standpoint, Nicholas Stanley biggest problem is insomnia.  He denies orthopnea or PND.  He is simply unable to sleep.  He states  that he feels very tired sitting in his recliner, but when he goes into  his bedroom to sleep he feels wide awake again.  He has completely  stopped cigarettes, and he does not consume caffeine.  His exertional  dyspnea is stable, but remains limiting.  He becomes short of breath  with activity such as vacuuming his floor.  As long as he paces himself,  he has no symptoms with low-level activity.  He denies chest pain or  pressure.  He denies nausea, vomiting, palpitations, or lightheadedness.   We have discussed an ICD on several occasions and I have even referred  him for an EP consultation.  He has been resistant to the idea, and he  cancelled his last appointment for that evaluation.  He tells me today  that he wishes to discuss things with his sister and uncle, both of whom  have ICDs.   MEDICATIONS:  1. Potassium chloride 20 mEq daily.  2. Lasix 40 mg daily.  3. Aspirin 81 mg daily.  4.  Simvastatin 40 mg at bedtime.  5. Enalapril 5 mg daily.  6. Coreg 6.25 mg twice daily.   ALLERGIES:  NKDA.   PHYSICAL EXAMINATION:  GENERAL:  The patient is alert and oriented.  No  acute distress.  VITAL SIGNS:  Weight 177 pounds, blood pressure 110/80, heart rate 69,  respiratory rate is 16.  HEENT:  Normal.  NECK:  Normal carotid upstrokes.  No bruits.  JVP normal.  LUNGS:  Prolonged expiratory phase, otherwise clear.  HEART:  Apex is palpable.  The heart is distant with no murmurs.  Heart  sounds are regular.  ABDOMEN:  Soft, nontender.  No organomegaly.  There is a midline easily  reducible umbilical hernia that is painless to palpation.  EXTREMITIES:  No clubbing, cyanosis, or edema.  Peripheral pulses are  intact and equal.   EKG shows sinus rhythm with left bundle-branch block and prior inferior  MI.   ASSESSMENT:  1. New York Heart Association class  III congestive heart failure      secondary to severe underlying ischemic cardiomyopathy.  I will      continue current medical therapy.  I do think Nicholas Stanley could      benefit from cardiac resynchronization therapy device and      defibrillator.  However, he remains reluctant to the idea.  As      above, he will discuss this issue with his sister.  For now, we      will continue his current medical program.  I will see him back in      3 months.  He appears euvolemic on exam, and he has had a      hypotensive response to increased medications in the past.      Therefore, I will not up-titrate his ACE or beta blocker.  2. Dyslipidemia.  He remains on simvastatin.  Lipids at last check for      most pertinent for a low HDL of 26, his LDL is at goal of 52, total      cholesterol is 88.  His LFTs were mildly increased, and I will      repeat those today.   For followup, I will see Nicholas Stanley in 3 months.     Veverly Fells. Excell Seltzer, MD  Electronically Signed    MDC/MedQ  DD: 05/03/2008  DT: 05/04/2008  Job #: [id number]

## 2010-10-03 NOTE — Assessment & Plan Note (Signed)
[facility]                            CARDIOLOGY OFFICE NOTE   NAME:MILAMBerkley, Cronkright                         MRN:          [id number]  DATE:04/30/2007                            DOB:          1945/09/09    Nicholas Stanley is a 65 year old gentleman with severe coronary artery  disease.  He has had multiple PCI procedures in the past, and he  presented with a large inferolateral MI on October 6th.  At  presentation, he was in shock with incessant ventricular tachycardia.  He also had ulcerated plaque involving his left main stem.  He went  emergently for multivessel coronary bypass surgery.  Following discharge  home, he has continued to progress slowly, but overall he is improving.  His main symptom at present is exertional dyspnea.  He has dyspnea with  fairly low-level exertion.  He became short of breath with walking in to  the office today.  He does not have rest dyspnea.  He has had no  recurrent chest pain.  He denies orthopnea, PND, edema, palpitations, or  syncope.  He has been taking his medications.  He has successfully  discontinued smoking.   MEDICATIONS:  Include:  1. Potassium 20 mEq daily.  2. Lasix 40 mg daily.  3. Aspirin 81 mg daily.  4. Coreg 3.125 mg twice daily.  5. Simvastatin 40 mg at bedtime.  6. Enalapril 2.5 mg daily.   ALLERGIES:  NO KNOWN DRUG ALLERGIES.   EXAMINATION:  The patient is alert and oriented.  He is in no acute  distress.  Weight is 174.  Blood pressure is 108/74.  Heart rate is 80.  Respiratory rate 16.  HEENT:  Normal.  NECK:  Normal carotid upstrokes without bruits.  Jugular venous pressure  is normal.  LUNGS:  Show scattered rhonchi, otherwise clear.  CARDIAC:  Heart sounds are distant.  Heart is regular rate and rhythm.  No murmurs appreciated.  ABDOMEN:  Soft and nontender.  No organomegaly.  There is a midline  abdominal wall hernia.  EXTREMITIES:  No clubbing, cyanosis, or edema.  Peripheral pulses 2+  and  equal throughout.   ASSESSMENT:  1. Coronary artery disease status post inferolateral myocardial      infarction.  Previous anterior myocardial infarction.  The patient      has advanced coronary artery disease, and is now status post      emergency multivessel coronary artery bypass graft.  I am pleased      with his slow progress from surgery.  His medical program is      reasonable, but he is on low doses of medications.  I have asked      him to increase his Coreg to 6.25 mg twice daily, and to increase      his enalapril to 5 mg daily.  Otherwise, his medications remain      unchanged.  2. Dyslipidemia.  He was started on Zocor at his last office visit.      He is due for lipids and liver function tests today.  3. Disability.  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  He is      clearly unable to do this kind of work anymore.  I am doubtful that      he will ever be able to participate in this type of work      environment in the setting of his advanced cardiac disease.  He is      going to go the [employer] and apply for disability,      and I told him I would be happy to fill out his paperwork, as I am      his treating cardiologist.  4. Post myocardial left ventricular dysfunction.  The patient had an      echo this morning.  I am awaiting the results.  He may ultimately      be a candidate for an ICD, but he is only 2 months out from his      event at this point.  I would like to see him back in 2 months, and      we will reassess at that time, and consider whether he will benefit      from a device based on his left ventricular ejection fraction.     Veverly Fells. Excell Seltzer, MD  Electronically Signed    MDC/MedQ  DD: 04/30/2007  DT: 04/30/2007  Job #: [id number]

## 2010-10-03 NOTE — Assessment & Plan Note (Signed)
[facility]                            CARDIOLOGY OFFICE NOTE   PAXON, PROPES                         MRN:          [id number]  DATE:07/01/2007                            DOB:          16-Aug-1945    Nicholas Stanley returns for follow-up at the [facility] office on  July 01, 2007.  Nicholas Stanley is a 65 year old gentleman with severe CAD  and congestive heart failure as well as lung disease.  He presented with  a large inferolateral infarct back in October 2008 complicated by shock  and incessant ventricular tachycardia.  He also had an ulcerated plaque  in his left mainstem and was taken for emergency open heart surgery.  Nicholas Stanley has done relatively well since his return home from the  hospital.  He has made very slow progress but overall has not had any  severe symptoms.  He has chronic exertional dyspnea which is really  unchanged.  He tells me today that yesterday he had an episode of right  arm heaviness and with diaphoresis.  This has been his anginal  equivalent in the past.  He experienced this for a brief time yesterday  and it resolved quickly after he took his heart medicine heart  medicine.  He was sure which medicine it was.  He has not had any other  problems.  He denies orthopnea, PND or edema.   CURRENT MEDICATIONS:  1. Potassium 20 mEq daily.  2. Lasix 40 mg daily.  3. Aspirin 81 mg daily.  4. Simvastatin 40 mg at bedtime.  5. Enalapril 5 mg daily.  6. Coreg 6.25 mg twice daily.   EXAM:  Weight is 172, blood pressure 98/70, heart rate 84.  HEENT:  Normal.  NECK:  Normal carotid upstrokes without bruits.  Jugular venous pressure  normal.  LUNGS:  Scattered rhonchi with fair air movement.  CARDIAC:  Heart sounds are distant.  HEART:  Regular rate and rhythm without murmurs.  ABDOMEN:  Soft, nontender no organomegaly.  EXTREMITIES:  No clubbing, cyanosis or edema.  Peripheral pulses 2+ and  equal throughout.   EKG  shows a sinus rhythm with a single PVC present.  Left bundle branch  block is present.   ASSESSMENT:  1. Coronary artery disease, status post inferolateral myocardial      infarction and prior anterior myocardial infarction.  As detailed      above, Nicholas Stanley was treated with a multivessel coronary artery      bypass grafting.  He has done relatively well considering the      critical nature of his problem.  I am going to continue his current      medical therapy today without changes.  I am a little concerned      about his episode yesterday and whether this represented recurrent      angina.  It is really difficult to manage Nicholas Stanley because his      resources are so limited.  I am not able to escalate his therapy      today because his blood pressure  has been borderline and I do not      think he will tolerate a higher dose of Coreg at this point.  I      have asked him to observe his symptoms closely and if he has      recurrent problems with increasing episodes of right arm heaviness,      he needs to be evaluated immediately either in the emergency room      or back in our office.  If he has any other symptoms reminiscent of      his initial event, I asked him to call EMS immediately.  Hopefully,      this will be an isolated episode and he will continue to do well      since he has just had recent bypass surgery.  2. Severe ischemic cardiomyopathy.  Nicholas Stanley left ventricular      ejection fraction was in the range of 20-25% at the time of his      echo in December of last year.  He had moderate diffuse hypokinesis      with akinesis of the entire inferoposterior wall as well as      akinesis of the periapical wall.  We will continue therapy as      detailed with medication at this point.  I really think he will be      better served with medical therapy with his advanced symptoms and      especially considering his degree of lung disease.  I discussed the      idea a  prophylactic implantable cardioverter-defibrillator for      prevention of sudden cardiac death but I really do not think that      is appropriate at this point because I think his overall prognosis      is really quite poor.  He was in agreement and did not seem to be      in favor of more invasive therapies.  3. Dyslipidemia.  For some reason he has been unable to fill his      simvastatin.  He has 11 refills left on the bottle but he tells me      the pharmacy cannot or will not give it to him.  He does not go to      the pharmacy himself because he does not drive.  His sons have been      getting his medication for him.  I told him that he really needs to      simvastatin and if there is anything I can do to help, to please      let me know.   For follow-up, I am going to see Nicholas Stanley back closely in 2 weeks to  make sure that he is doing okay and not having further episodes  suggestive of angina.  If he is having any progressive symptoms, I think  he is going to need repeat cardiac catheterization.     Nicholas Stanley. Excell Seltzer, MD  Electronically Signed    MDC/MedQ  DD: 07/01/2007  DT: 07/03/2007  Job #: [id number]

## 2010-10-03 NOTE — Assessment & Plan Note (Signed)
[facility]                            CARDIOLOGY OFFICE NOTE   Nicholas Stanley                         MRN:          [id number]  DATE:01/23/2008                            DOB:          Mar 22, 1946    REASON FOR EVALUATION:  Coronary artery disease and congestive heart  failure.   HISTORY OF PRESENT ILLNESS:  Nicholas Stanley is a 65 year old gentleman with  severe CAD and multiple past myocardial infarctions with ongoing [place]  [place] Heart Association class III congestive heart failure symptoms  secondary to severe underlying ischemic cardiomyopathy.  He had a large  inferoposterior MI in October 2008 and underwent emergency bypass  surgery.  He also had mitral valve annuloplasty ring placed at that  time.  His post MI echo even after several months show severe residual  LV dysfunction.  His LVEF is 20%.  There is akinesis of the entire  inferoposterior wall as well as the inferoseptum.   The patient is getting along reasonably well.  He has chronic exertional  dyspnea.  He denies dyspnea at rest.  He has had no chest or right  shoulder pain, which has been his previous anginal equivalent.  He  denies edema, orthopnea, or PND.  He has become concerned over an  abdominal hernia.  He denies abdominal pain.   I have referred him for an EP consultation for ICD in the setting of his  severe ischemic heart disease.  However, he cancelled the appointment as  he does not wish to proceed with an ICD.   MEDICATIONS:  1. Potassium 20 mEq daily.  2. Furosemide 40 mg daily.  3. Aspirin 81 mg daily.  4. Simvastatin 40 mg at bedtime.  5. Enalapril 5 mg daily.  6. Coreg 6.25 mg twice daily.   ALLERGIES:  NKDA.   PHYSICAL EXAMINATION:  GENERAL:  The patient is alert and oriented.  He  is in no distress.  VITAL SIGNS:  Weight is 170 pounds, blood pressure 110/88, heart rate  72, and respiratory rate 18.  HEENT:  Normal.  NECK:  Normal carotid upstrokes.  No  bruits.  JVP normal.  LUNGS:  Clear bilaterally.  HEART:  Regular rate and rhythm.  There are no murmurs or gallops.  ABDOMEN:  Soft and nontender.  No organomegaly.  There is a midline  abdominal hernia that is relatively small at the site of previous  surgical scar, it is easily reducible.  EXTREMITIES:  No clubbing, cyanosis, or edema.   EKG shows normal sinus rhythm with left bundle-branch block.   ASSESSMENT:  1. Congestive heart failure, New York Heart Association class II to      III.  The patient with severe underlying ischemic cardiomyopathy.      We will continue his current medical regimen for now.  He has been      intolerant to HIGHER DOSES OF ACE INHIBITORS because of symptomatic      hypotension.  His volume status appears normal.  We will check a      metabolic panel today since it has been several  months since his      last lab work.  2. Coronary artery disease with multiple previous myocardial      infarctions.  He is status post coronary artery bypass grafting.      Continue secondary risk reduction with aspirin, simvastatin, Coreg,      and enalapril.  No angina at present.  3. Risk for sudden cardiac death.  Had a long discussion with Mr.      Westbrooks again today.  He really does not wish to proceed with      implantable cardioverter-defibrillator evaluation.  We will      continue with medical therapy.   For followup, I will see the patient back in 4 months.  We will review  his some lab work when it is available today.  A lipid panel, metabolic  panel, and CBC were all checked.     Veverly Fells. Excell Seltzer, MD  Electronically Signed    MDC/MedQ  DD: 01/23/2008  DT: 01/24/2008  Job #: [id number]

## 2010-10-03 NOTE — Cardiovascular Report (Signed)
Nicholas Stanley, Nicholas Stanley                ACCOUNT NO.:  [id number]   MEDICAL RECORD NO.:  [id number]          PATIENT TYPE:  INP   LOCATION:  2857                         FACILITY:  [facility]   PHYSICIAN:  Veverly Fells. Excell Seltzer, MD  DATE OF BIRTH:  1945-06-11   DATE OF PROCEDURE:  02/24/2007  DATE OF DISCHARGE:                            CARDIAC CATHETERIZATION   PROCEDURE:  Left heart catheterization, selective coronary angiography,  left ventricular angiography, temporary transvenous pacemaker placement  through the right femoral vein, and intra-aortic balloon pump insertion.   INDICATIONS:  Mr. Tiberio is a 65 year old gentleman who came directly to  the cath lab via [facility] with an evolving myocardial  infarction.  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  Upon EMS arrival, his heart  rate was in the 40s and blood pressure was in the 70s.  His ECG in the  field was suggestive of an inferoposterior MI.  A code STEMI was called,  and he was brought emergently for cardiac catheterization.   The risks and indications of the procedure were reviewed with the  patient.  Emergent implied consent was obtained.  The right groin was  prepped, draped, and anesthetized with 1% lidocaine using the modified  Seldinger technique.  A 6-French sheath was placed in the right femoral  artery and a 6-French sheath was placed in the right femoral vein  without difficulty.  A temporary transvenous pacemaker was initially  placed in order to stabilize the patient's rhythm.  Using normal  technique, the balloon-tipped catheter was placed in the RV apex without  much difficulty.  The patient was paced at 70 beats per minute.  At that  point, a diagnostic JL-4 catheter was inserted into the left coronary  artery.  An initial image demonstrated a plaque rupture involving the  left mainstem.  Only one image was taken.  A JR-4 catheter was then  inserted  into the right coronary artery, and this was found also to be  totally occluded.  An emergency consultation was obtained with Dr.  Dorris Fetch in the CVTS group.  At that point, I elected to access the  left femoral artery, and an intra-aortic balloon pump was placed.  Using  the modified Seldinger technique, an 8-French sheath was placed in the  left femoral artery, and the balloon pump was placed under normal  sterile conditions.  Just prior to that, a pigtail catheter was used to  perform a left ventriculogram, a pullback across the aortic valve, as  well as a thoracic aortogram.  Just before we were ready to transport  the patient to the operating room, he developed ventricular fibrillation  on multiple occasions, and he was defibrillated approximately 4-5 times  in the cath lab.  150 mg total of intravenous lidocaine was given.  The  patient was taken emergently to the operating suite in critical  condition.   Dr. Dorris Fetch and I both spoke with the patient's sister who is his  next of kin and informed her of the situation.  She is  going to contact  the rest of his family who will come in immediately.   FINDINGS:  Aortic pressure 104/65 with a mean of 81, left ventricular  pressure 101/21.   Coronary angiography:  The ostium and proximal portion of the left  mainstem show an area that is suggestive of plaque rupture.  There is an  extraluminal dye stain.  There appears to be TIMI III flow in the LAD  and left circumflex.  The distal left mainstem has minor stenosis in the  range of 20-30%.   The LAD has a long segment of stents in the proximal and midportion.  There also appears to be a stent in the first diagonal branch.  From the  limited images, the LAD is patent and there appears to be nonobstructive  disease throughout.  The diagonal branch is also patent.   The left circumflex has a long segment of stent in its midportion that  appears to be patent, and there is TIMI  III flow in that vessel.  There  appears to be some moderate disease off the proximal aspect of the  stent, and there is suggestion of a filling defect also in the proximal  portion of the stent.   The right coronary artery is severely diseased.  The proximal portion  has an ulcerated plaque with an approximate 50% stenosis.  The vessel  was ectatic throughout, and the distal portion of the right coronary  artery is totally occluded.  There is heavy calcification throughout the  right coronary artery.   Left ventriculography demonstrates severe segmental left ventricular  systolic dysfunction with a large area of inferior wall akinesis and  anterolateral as well as apical hypokinesis.  The LVEF is estimated at  25%.   Thoracic aortogram:  Descending thoracic aortogram demonstrates diffuse  irregularity and mild tortuosity but a reasonable aorta to place an  intra-aortic balloon pump.  The left renal artery is well-visualized and  is patent.  The right renal artery is not as well seen but appears  patent.   ASSESSMENT:  1. Acute inferoposterior myocardial infarction secondary to acutely      occluded right coronary artery.  2. Cardiogenic shock.  3. Complete heart block treated with a temporary transvenous      pacemaker.  4. Ruptured plaque in the left mainstem with extraluminal dye stain.  5. Patent left anterior descending and left circumflex stents.  6. Severe segmental left ventricular systolic dysfunction with an      overall left ventricular ejection fraction in the range of 20-30%.   PLAN:  CVTS was emergently consulted.  Dr. Dorris Fetch immediately came  down to the cath lab and evaluated the patient.  There is an operating  room open, and the patient was transported in critical condition to the  operating room.  Dr. Dorris Fetch and I both spoke with the patient's  sister and informed her of this very critical situation as well as high  risk nature of his bypass surgery  under emergent conditions with  cardiogenic shock, heart block, ongoing infarction, and ventricular  rhythm instability.      Veverly Fells. Excell Seltzer, MD  Electronically Signed     MDC/MEDQ  D:  02/24/2007  T:  02/24/2007  Job:  [id number]

## 2010-10-10 ENCOUNTER — Encounter: Payer: Self-pay | Admitting: *Deleted

## 2010-10-16 ENCOUNTER — Other Ambulatory Visit: Payer: Self-pay | Admitting: Cardiovascular Disease

## 2010-10-20 ENCOUNTER — Ambulatory Visit (INDEPENDENT_AMBULATORY_CARE_PROVIDER_SITE_OTHER): Payer: Medicare Other | Admitting: Cardiovascular Disease

## 2010-10-20 ENCOUNTER — Encounter: Payer: Self-pay | Admitting: Cardiovascular Disease

## 2010-10-20 DIAGNOSIS — E785 Hyperlipidemia, unspecified: Secondary | ICD-10-CM

## 2010-10-20 DIAGNOSIS — I25810 Atherosclerosis of coronary artery bypass graft(s) without angina pectoris: Secondary | ICD-10-CM

## 2010-10-20 DIAGNOSIS — I2510 Atherosclerotic heart disease of native coronary artery without angina pectoris: Secondary | ICD-10-CM

## 2010-10-20 DIAGNOSIS — I5022 Chronic systolic (congestive) heart failure: Secondary | ICD-10-CM

## 2010-10-20 DIAGNOSIS — E7800 Pure hypercholesterolemia, unspecified: Secondary | ICD-10-CM

## 2010-10-20 LAB — BASIC METABOLIC PANEL
CO2: 31 mEq/L (ref 19–32)
Calcium: 8.9 mg/dL (ref 8.4–10.5)
Chloride: 106 mEq/L (ref 96–112)
Glucose, Bld: 86 mg/dL (ref 70–99)
Potassium: 4.1 mEq/L (ref 3.5–5.1)
Sodium: 143 mEq/L (ref 135–145)

## 2010-10-20 LAB — CBC WITH DIFFERENTIAL/PLATELET
Basophils Relative: 0.5 % (ref 0.0–3.0)
Eosinophils Absolute: 0.3 10*3/uL (ref 0.0–0.7)
Hemoglobin: 13.5 g/dL (ref 13.0–17.0)
Lymphocytes Relative: 17.1 % (ref 12.0–46.0)
Lymphs Abs: 1.5 10*3/uL (ref 0.7–4.0)
Monocytes Absolute: 0.5 10*3/uL (ref 0.1–1.0)
RBC: 4.58 Mil/uL (ref 4.22–5.81)
WBC: 9.1 10*3/uL (ref 4.5–10.5)

## 2010-10-20 LAB — HEPATIC FUNCTION PANEL
ALT: 11 U/L (ref 0–53)
AST: 13 U/L (ref 0–37)
Albumin: 3.8 g/dL (ref 3.5–5.2)
Alkaline Phosphatase: 132 U/L — ABNORMAL HIGH (ref 39–117)
Bilirubin, Direct: 0.2 mg/dL (ref 0.0–0.3)
Total Protein: 6.7 g/dL (ref 6.0–8.3)

## 2010-10-20 NOTE — Progress Notes (Signed)
HPI:  This is a 65 year old gentleman presenting for followup evaluation. The patient has a history of multivessel coronary disease and severe left ventricular dysfunction secondary to ischemic cardiomyopathy. He presented with an inferoposterior MI in 2008 and required emergency multivessel coronary bypass surgery at that time. The patient has declined cardiac resynchronization or placement of an ICD.  He continues to do well from a symptomatic standpoint. He denies chest pain, dyspnea, edema, palpitations, lightheadedness, or syncope. He is inactive. He used to drive a truck and he wishes he could continue with his work because he states "the days run together as her when I am at home everyday."  Outpatient Encounter Prescriptions as of 10/20/2010  Medication Sig Dispense Refill  . aspirin 81 MG tablet Take 81 mg by mouth daily.        . carvedilol (COREG) 12.5 MG tablet Take 12.5 mg by mouth 2 (two) times daily with a meal.        . digoxin (LANOXIN) 0.125 MG tablet TAKE 1 TABLET BY MOUTH ONCE DAILY  30 tablet  6  . furosemide (LASIX) 80 MG tablet Take 80 mg by mouth daily.        Marland Kitchen KLOR-CON M20 20 MEQ tablet TAKE 2 TABLETS ONCE DAILY  60 tablet  5  . simvastatin (ZOCOR) 40 MG tablet Take 1 tablet (40 mg total) by mouth every evening.  30 tablet  11  . valsartan (DIOVAN) 40 MG tablet Take 40 mg by mouth daily.        Marland Kitchen DISCONTD: carvedilol (COREG) 6.25 MG tablet Take 6.25 mg by mouth 2 (two) times daily with a meal.          Not on File  Past Medical History  Diagnosis Date  . CAD, ARTERY BYPASS GRAFT   . CARDIOMYOPATHY, ISCHEMIC   . HYPERLIPIDEMIA-MIXED   . RENAL ARTERY STENOSIS     ROS: Negative except as per HPI  BP 98/57  Ht 5\' 9"  (1.753 m)  Wt 171 lb (77.565 kg)  BMI 25.25 kg/m2  PHYSICAL EXAM: Pt is alert and oriented, NAD HEENT: normal Neck: JVP - normal, carotids 2+= without bruits Lungs: CTA bilaterally CV: RRR without murmur or gallop Abd: soft, NT, Positive BS,  ventral hernia easily reducible without tenderness Ext: no C/C/E, distal pulses intact and equal Skin: warm/dry no rash  EKG:  Sinus bradycardia with first degree AV block, 57 beats per minute, left bundle branch block. Unchanged from previous ECG.  ASSESSMENT AND PLAN:

## 2010-10-20 NOTE — Assessment & Plan Note (Signed)
No recent heart failure symptoms. He is maintained on diuretic therapy with furosemide. He is on carvedilol and valsartan. The patient is due for followup metabolic panel. Note he has chronic kidney disease with creatinine around 2.0 mg per deciliter.

## 2010-10-20 NOTE — Assessment & Plan Note (Signed)
The patient's LDL is been less than 100 mg per deciliter. His HDL is very low, most recently 22. He is maintained on simvastatin and will have followup lipids today.

## 2010-10-20 NOTE — Patient Instructions (Signed)
Your physician wants you to follow-up in: 6 months with Dr Theodoro Parma will receive a reminder letter in the mail two months in advance. If you don't receive a letter, please call our office to schedule the follow-up appointment.   Your physician recommends that you return for lab work today:  BMP/CBC/LIVER/LIPID  272.0  428.22  414.01  Your physician recommends that you continue on your current medications as directed. Please refer to the Current Medication list given to you today.

## 2010-10-20 NOTE — Assessment & Plan Note (Signed)
The patient is stable without angina. He will continue on antiplatelet therapy with aspirin and secondary risk reduction measures as outlined. Continue clinical followup in 6 months.

## 2010-12-16 ENCOUNTER — Other Ambulatory Visit: Payer: Self-pay | Admitting: Cardiovascular Disease

## 2011-01-16 ENCOUNTER — Other Ambulatory Visit: Payer: Self-pay | Admitting: Cardiovascular Disease

## 2011-02-08 ENCOUNTER — Telehealth: Payer: Self-pay | Admitting: Cardiovascular Disease

## 2011-02-08 NOTE — Telephone Encounter (Signed)
I spoke with the pt because our office called him about a notification we received from [facility] in regards to him stopping Simvastatin. The pt said that he is taking this medication.  The pt said that he has been in [facility] and had a drain placed in his gallbladder.  The pt also is due to see Dr Excell Seltzer in December and an appointment has been scheduled.

## 2011-02-08 NOTE — Telephone Encounter (Signed)
Returning your call. °

## 2011-02-13 DIAGNOSIS — E785 Hyperlipidemia, unspecified: Secondary | ICD-10-CM | POA: Insufficient documentation

## 2011-02-13 DIAGNOSIS — I10 Essential (primary) hypertension: Secondary | ICD-10-CM | POA: Insufficient documentation

## 2011-02-13 DIAGNOSIS — I509 Heart failure, unspecified: Secondary | ICD-10-CM | POA: Insufficient documentation

## 2011-02-13 DIAGNOSIS — Z951 Presence of aortocoronary bypass graft: Secondary | ICD-10-CM | POA: Insufficient documentation

## 2011-02-13 DIAGNOSIS — I219 Acute myocardial infarction, unspecified: Secondary | ICD-10-CM | POA: Insufficient documentation

## 2011-02-19 ENCOUNTER — Telehealth: Payer: Self-pay | Admitting: Cardiovascular Disease

## 2011-02-19 DIAGNOSIS — R0600 Dyspnea, unspecified: Secondary | ICD-10-CM

## 2011-02-19 DIAGNOSIS — I10 Essential (primary) hypertension: Secondary | ICD-10-CM

## 2011-02-19 NOTE — Telephone Encounter (Signed)
Pt calling wanting to see if MD still wanted pt to take digoxin and furosemide. Please return pt call to discuss further. When pt D/C from hospital was told not to take these two RX. Please return pt call to discuss further.

## 2011-02-19 NOTE — Telephone Encounter (Signed)
Pt called about his med

## 2011-02-19 NOTE — Telephone Encounter (Signed)
Left message

## 2011-02-20 NOTE — Telephone Encounter (Signed)
Pt rtn call [phone]

## 2011-02-20 NOTE — Telephone Encounter (Signed)
Mr. Flott called stating he was at [facility] about 3-4 wks ago. They stopped his "fluid pill and heart pill". Was admitted for what they thought was appendicitis but instead put a "tube in his side". States he was having a lot of swelling in his ankles and so he restarted his "fluid pill" on Sunday. Feels much better now. Doesn't know why the meds were stopped. Spoke w/Dr. Excell Seltzer and he advises that pt come in tomorrow for Bmet and BNP and will see Sunday Spillers to evaluate kidney function. Pt agreeable to this plan and will come in. Will order labs stat and see Lori at 1:30.

## 2011-02-21 ENCOUNTER — Encounter: Payer: Self-pay | Admitting: Nurse Practitioner

## 2011-02-21 ENCOUNTER — Ambulatory Visit: Payer: Medicare Other | Admitting: Nurse Practitioner

## 2011-02-21 ENCOUNTER — Telehealth: Payer: Self-pay | Admitting: Cardiovascular Disease

## 2011-02-21 ENCOUNTER — Ambulatory Visit (INDEPENDENT_AMBULATORY_CARE_PROVIDER_SITE_OTHER): Payer: Medicare Other | Admitting: *Deleted

## 2011-02-21 ENCOUNTER — Other Ambulatory Visit: Payer: Self-pay | Admitting: *Deleted

## 2011-02-21 ENCOUNTER — Ambulatory Visit (INDEPENDENT_AMBULATORY_CARE_PROVIDER_SITE_OTHER): Payer: Medicare Other | Admitting: Nurse Practitioner

## 2011-02-21 DIAGNOSIS — I701 Atherosclerosis of renal artery: Secondary | ICD-10-CM

## 2011-02-21 DIAGNOSIS — I25810 Atherosclerosis of coronary artery bypass graft(s) without angina pectoris: Secondary | ICD-10-CM

## 2011-02-21 DIAGNOSIS — I2589 Other forms of chronic ischemic heart disease: Secondary | ICD-10-CM

## 2011-02-21 DIAGNOSIS — I5022 Chronic systolic (congestive) heart failure: Secondary | ICD-10-CM

## 2011-02-21 DIAGNOSIS — R109 Unspecified abdominal pain: Secondary | ICD-10-CM | POA: Insufficient documentation

## 2011-02-21 DIAGNOSIS — R0602 Shortness of breath: Secondary | ICD-10-CM

## 2011-02-21 DIAGNOSIS — R609 Edema, unspecified: Secondary | ICD-10-CM

## 2011-02-21 DIAGNOSIS — E785 Hyperlipidemia, unspecified: Secondary | ICD-10-CM

## 2011-02-21 LAB — BASIC METABOLIC PANEL
Calcium: 8.4 mg/dL (ref 8.4–10.5)
Creatinine, Ser: 1.8 mg/dL — ABNORMAL HIGH (ref 0.4–1.5)
GFR: 41.34 mL/min — ABNORMAL LOW (ref >60.00)
Glucose, Bld: 96 mg/dL (ref 70–99)
Sodium: 141 mEq/L (ref 135–145)

## 2011-02-21 NOTE — Progress Notes (Signed)
Nicholas Stanley Date of Birth: [birth year]   History of Present Illness: Nicholas Stanley is seen today for Dr. Excell Seltzer. It is an early visit. He had been doing ok from his heart standpoint. About 3 to 4 weeks ago he had nausea and then developed right sided abdominal pain. He went to [place] and was transferred to [facility]. They thought he was having an appendicitis but that was not the case. There was concern for gallbladder disease. He says they could not figure out what was wrong with him. He had a drainage tube placed. He empties it daily. It is draining less. While he was there he was taken off his fluid pills and digoxin. He was off for about 2 weeks and then "swelled right up". He restarted his medicines. There was concern about his labs. He came in earlier today for BNP and BMET. He now feels well with no complaint whatsoever. No more belly pain.   Current Outpatient Prescriptions on File Prior to Visit  Medication Sig Dispense Refill  . aspirin 81 MG tablet Take 81 mg by mouth daily.        . carvedilol (COREG) 12.5 MG tablet take 1 tablet by mouth twice a day  60 tablet  6  . digoxin (LANOXIN) 0.125 MG tablet TAKE 1 TABLET BY MOUTH ONCE DAILY  30 tablet  6  . DIOVAN 40 MG tablet TAKE 1 TABLET BY MOUTH TWICE A DAY  60 tablet  5  . furosemide (LASIX) 80 MG tablet take 1 tablet by mouth once daily  30 tablet  7  . KLOR-CON M20 20 MEQ tablet TAKE 2 TABLETS ONCE DAILY  60 tablet  5  . simvastatin (ZOCOR) 40 MG tablet Take 1 tablet (40 mg total) by mouth every evening.  30 tablet  11    No Known Allergies  Past Medical History  Diagnosis Date  . CAD, ARTERY BYPASS GRAFT 2008  . CARDIOMYOPATHY, ISCHEMIC     has refused BiV ICD  . HYPERLIPIDEMIA-MIXED   . RENAL ARTERY STENOSIS   . Old MI (myocardial infarction) 2008    Past Surgical History  Procedure Date  . Coronary artery bypass graft 2008  . Mitral valve repair 2008    History  Smoking status  . Current Everyday Smoker    Smokeless tobacco  . Not on file    History  Alcohol Use No    Family History  Problem Relation Age of Onset  . Coronary artery disease Other     Review of Systems: The review of systems is positive for recent abdominal pain. No chest pain. No swelling. He is still smoking some. He coughs but sputum is clear. No fever or chills reported. Bowels and appetite ok.  All other systems were reviewed and are negative.  Physical Exam: BP 104/58  Pulse 76  Ht 5\' 9"  (1.753 m)  Wt 171 lb (77.565 kg)  BMI 25.25 kg/m2 Patient is very pleasant and in no acute distress. Skin is weathered and dry. Color is normal.  HEENT is unremarkable except for very poor dentition. Normocephalic/atraumatic. PERRL. Sclera are nonicteric. Neck is supple. No masses. No JVD. Lungs show scattered ronchi. Cardiac exam shows a regular rate and rhythm. He has a soft S3. Abdomen is soft. Drainage tube on the right noted. Sternal scar noted. Has incisional/ventral hernia.  Extremities are without edema. Gait and ROM are intact. No gross neurologic deficits noted.   LABORATORY DATA: PENDING   Assessment / Plan:

## 2011-02-21 NOTE — Assessment & Plan Note (Signed)
He looks compensated at this time. He is back on his medicines. We will see what his labs look like. He was subsequently seen by Dr. Excell Seltzer as well. Will tentatively see him back at his regular appointment time in December with Dr. Excell Seltzer. Patient is agreeable to this plan and will call if any problems develop in the interim.

## 2011-02-21 NOTE — Telephone Encounter (Signed)
Release faxed to [phone]/[id number] 02-21-11 djc

## 2011-02-21 NOTE — Assessment & Plan Note (Signed)
He has a drainage tube in place on the right side of his abdomen. I suspect he has some gallbladder issue. Dr. Excell Seltzer and I have told him that if surgery was needed that he may just want to come to [facility]. We will obtain his records for better clarification of what was done for him there. Overall he is improved. The tube is to come out next Tuesday.

## 2011-02-21 NOTE — Patient Instructions (Signed)
Stay on your current medicines We will see what your labs look like We will see you back at your regular time.  We will get a copy of your records from [place] [facility].

## 2011-02-22 ENCOUNTER — Telehealth: Payer: Self-pay | Admitting: Cardiovascular Disease

## 2011-02-22 NOTE — Telephone Encounter (Signed)
Pt returning call from someone this morning. Pt said call was regarding pt lab work. Please return call to discuss further.

## 2011-02-22 NOTE — Telephone Encounter (Signed)
Lab results and MD recommendations given. Patient is aware to take 40 meq in the AM and 20 mEq in PM.

## 2011-02-23 NOTE — Telephone Encounter (Signed)
Records received from [facility] gave to [facility] for Appt on 05/11/11    02/23/11/km

## 2011-03-01 LAB — CBC
HCT: 28.6 — ABNORMAL LOW
HCT: 32 — ABNORMAL LOW
HCT: 33.4 — ABNORMAL LOW
Hemoglobin: 10.9 — ABNORMAL LOW
MCHC: 33.5
MCHC: 33.6
MCHC: 33.7
MCHC: 33.9
MCV: 88.5
MCV: 88.9
MCV: 89.6
MCV: 90.1
Platelets: 107 — ABNORMAL LOW
Platelets: 115 — ABNORMAL LOW
Platelets: 178
Platelets: 78 — ABNORMAL LOW
Platelets: 81 — ABNORMAL LOW
Platelets: 90 — ABNORMAL LOW
RBC: 3.19 — ABNORMAL LOW
RBC: 3.47 — ABNORMAL LOW
RBC: 3.61 — ABNORMAL LOW
RBC: 3.72 — ABNORMAL LOW
RBC: 3.77 — ABNORMAL LOW
RDW: 15.4 — ABNORMAL HIGH
RDW: 15.7 — ABNORMAL HIGH
RDW: 15.7 — ABNORMAL HIGH
RDW: 15.8 — ABNORMAL HIGH
RDW: 15.9 — ABNORMAL HIGH
WBC: 13.1 — ABNORMAL HIGH
WBC: 13.4 — ABNORMAL HIGH
WBC: 14.9 — ABNORMAL HIGH
WBC: 15.8 — ABNORMAL HIGH

## 2011-03-01 LAB — CROSSMATCH: ABO/RH(D): O NEG

## 2011-03-01 LAB — I-STAT EC8
Acid-base deficit: 3 — ABNORMAL HIGH
BUN: 15
Bicarbonate: 20.8
Bicarbonate: 22.7
Chloride: 109
Glucose, Bld: 196 — ABNORMAL HIGH
HCT: 30 — ABNORMAL LOW
HCT: 30 — ABNORMAL LOW
Hemoglobin: 10.2 — ABNORMAL LOW
Hemoglobin: 13.3
Operator id: 257021
Operator id: 284731
Potassium: 4
Potassium: 4.4
Sodium: 142
Sodium: 143
TCO2: 22
TCO2: 23
TCO2: 24
pCO2 arterial: 25.9 — ABNORMAL LOW
pCO2 arterial: 39.5
pH, Arterial: 7.357

## 2011-03-01 LAB — BASIC METABOLIC PANEL
BUN: 13
BUN: 17
BUN: 30 — ABNORMAL HIGH
CO2: 27
CO2: 29
CO2: 30
CO2: 30
Calcium: 7.5 — ABNORMAL LOW
Calcium: 8.2 — ABNORMAL LOW
Calcium: 8.4
Calcium: 8.6
Calcium: 8.8
Chloride: 100
Chloride: 102
Chloride: 112
Creatinine, Ser: 1.25
Creatinine, Ser: 1.54 — ABNORMAL HIGH
Creatinine, Ser: 1.54 — ABNORMAL HIGH
GFR calc Af Amer: 56 — ABNORMAL LOW
GFR calc Af Amer: 56 — ABNORMAL LOW
GFR calc Af Amer: >60
GFR calc Af Amer: >60
GFR calc non Af Amer: 53 — ABNORMAL LOW
GFR calc non Af Amer: 53 — ABNORMAL LOW
Glucose, Bld: 108 — ABNORMAL HIGH
Glucose, Bld: 117 — ABNORMAL HIGH
Glucose, Bld: 119 — ABNORMAL HIGH
Glucose, Bld: 131 — ABNORMAL HIGH
Glucose, Bld: 147 — ABNORMAL HIGH
Potassium: 4.4
Sodium: 139
Sodium: 141
Sodium: 141

## 2011-03-01 LAB — POCT I-STAT 3, ART BLOOD GAS (G3+)
Acid-base deficit: 2
Acid-base deficit: 3 — ABNORMAL HIGH
Acid-base deficit: 4 — ABNORMAL HIGH
Acid-base deficit: 4 — ABNORMAL HIGH
Bicarbonate: 19.5 — ABNORMAL LOW
Bicarbonate: 21.5
Bicarbonate: 22
Bicarbonate: 22.3
O2 Saturation: 100
O2 Saturation: 100
O2 Saturation: 100
O2 Saturation: 91
O2 Saturation: 92
Operator id: 284701
Operator id: 284731
Operator id: 3291
TCO2: 20
TCO2: 23
TCO2: 23
TCO2: 24
TCO2: 25
pCO2 arterial: 30.4 — ABNORMAL LOW
pCO2 arterial: 37.7
pCO2 arterial: 39.3
pCO2 arterial: 54.3 — ABNORMAL HIGH
pH, Arterial: 7.361
pH, Arterial: 7.379
pH, Arterial: 7.399
pO2, Arterial: 215 — ABNORMAL HIGH
pO2, Arterial: 238 — ABNORMAL HIGH
pO2, Arterial: 65 — ABNORMAL LOW

## 2011-03-01 LAB — POCT I-STAT 4, (NA,K, GLUC, HGB,HCT)
Glucose, Bld: 135 — ABNORMAL HIGH
Glucose, Bld: 137 — ABNORMAL HIGH
Glucose, Bld: 141 — ABNORMAL HIGH
HCT: 23 — ABNORMAL LOW
HCT: 23 — ABNORMAL LOW
HCT: 24 — ABNORMAL LOW
HCT: 24 — ABNORMAL LOW
HCT: 26 — ABNORMAL LOW
HCT: 35 — ABNORMAL LOW
Hemoglobin: 11.9 — ABNORMAL LOW
Hemoglobin: 14.3
Hemoglobin: 7.8 — CL
Hemoglobin: 7.8 — CL
Hemoglobin: 8.2 — ABNORMAL LOW
Hemoglobin: 8.5 — ABNORMAL LOW
Operator id: 284731
Operator id: 3291
Operator id: 3291
Operator id: 3291
Operator id: 3291
Operator id: 3291
Potassium: 3.8
Potassium: 3.9
Potassium: 4
Potassium: 4.3
Potassium: 4.5
Potassium: 5
Sodium: 136
Sodium: 138
Sodium: 141
Sodium: 141
Sodium: 142
Sodium: 144

## 2011-03-01 LAB — DIFFERENTIAL
Basophils Absolute: 0.1
Eosinophils Absolute: 0.1
Eosinophils Relative: 1
Monocytes Absolute: 0.6

## 2011-03-01 LAB — PREPARE PLATELET PHERESIS

## 2011-03-01 LAB — COMPREHENSIVE METABOLIC PANEL
ALT: 12
ALT: 38
AST: 17
AST: 92 — ABNORMAL HIGH
Albumin: 2.6 — ABNORMAL LOW
Albumin: 2.9 — ABNORMAL LOW
Alkaline Phosphatase: 85
Alkaline Phosphatase: 96
BUN: 18
CO2: 24
Chloride: 105
GFR calc Af Amer: 55 — ABNORMAL LOW
GFR calc Af Amer: >60
GFR calc non Af Amer: 46 — ABNORMAL LOW
Potassium: 3 — ABNORMAL LOW
Potassium: 3.5
Sodium: 138
Sodium: 141
Total Bilirubin: 0.5
Total Protein: 5 — ABNORMAL LOW

## 2011-03-01 LAB — BLOOD GAS, ARTERIAL
Acid-base deficit: 1.3
FIO2: 1
TCO2: 28.1
pCO2 arterial: 34 — ABNORMAL LOW
pCO2 arterial: 47.2 — ABNORMAL HIGH
pH, Arterial: 7.325 — ABNORMAL LOW
pH, Arterial: 7.512 — ABNORMAL HIGH
pO2, Arterial: 42.6 — ABNORMAL LOW
pO2, Arterial: 85.1

## 2011-03-01 LAB — CARDIAC PANEL(CRET KIN+CKTOT+MB+TROPI)
Relative Index: 8.1 — ABNORMAL HIGH
Relative Index: INVALID
Total CK: 59
Troponin I: 0.36 — ABNORMAL HIGH
Troponin I: >100

## 2011-03-01 LAB — CREATININE, SERUM
Creatinine, Ser: 1.21
GFR calc Af Amer: >60
GFR calc non Af Amer: >60

## 2011-03-01 LAB — PROTIME-INR
INR: 1.3
INR: 1.4
INR: 1.4
INR: 1.8 — ABNORMAL HIGH
INR: 2.2 — ABNORMAL HIGH
INR: 2.3 — ABNORMAL HIGH
Prothrombin Time: 16.3 — ABNORMAL HIGH
Prothrombin Time: 16.3 — ABNORMAL HIGH
Prothrombin Time: 17.9 — ABNORMAL HIGH
Prothrombin Time: 21 — ABNORMAL HIGH
Prothrombin Time: 21.5 — ABNORMAL HIGH
Prothrombin Time: 25.1 — ABNORMAL HIGH
Prothrombin Time: 26.3 — ABNORMAL HIGH

## 2011-03-01 LAB — ABO/RH: ABO/RH(D): O NEG

## 2011-03-01 LAB — I-STAT 8, (EC8 V) (CONVERTED LAB)
Acid-Base Excess: 2
Acid-Base Excess: 4 — ABNORMAL HIGH
Bicarbonate: 26.2 — ABNORMAL HIGH
Chloride: 99
HCT: 28 — ABNORMAL LOW
Operator id: 285121
TCO2: 28
pCO2, Ven: 36.5 — ABNORMAL LOW
pCO2, Ven: 38 — ABNORMAL LOW
pH, Ven: 7.482 — ABNORMAL HIGH

## 2011-03-01 LAB — APTT: aPTT: 47 — ABNORMAL HIGH

## 2011-03-01 LAB — POCT I-STAT 3, VENOUS BLOOD GAS (G3P V)
O2 Saturation: 67
TCO2: 24
pCO2, Ven: 46.2
pO2, Ven: 38

## 2011-03-01 LAB — HEMOGLOBIN AND HEMATOCRIT, BLOOD: Hemoglobin: 8.5 — ABNORMAL LOW

## 2011-03-01 LAB — MAGNESIUM
Magnesium: 2.4
Magnesium: 2.4

## 2011-03-01 LAB — POCT I-STAT CREATININE: Creatinine, Ser: 1.5

## 2011-03-01 LAB — B-NATRIURETIC PEPTIDE (CONVERTED LAB): Pro B Natriuretic peptide (BNP): 1461 — ABNORMAL HIGH

## 2011-04-18 ENCOUNTER — Other Ambulatory Visit: Payer: Self-pay | Admitting: Cardiovascular Disease

## 2011-05-11 ENCOUNTER — Ambulatory Visit (INDEPENDENT_AMBULATORY_CARE_PROVIDER_SITE_OTHER): Payer: Medicare Other | Admitting: Cardiovascular Disease

## 2011-05-11 ENCOUNTER — Encounter: Payer: Self-pay | Admitting: Cardiovascular Disease

## 2011-05-11 VITALS — BP 91/57 | HR 41 | Ht 69.0 in | Wt 167.4 lb

## 2011-05-11 DIAGNOSIS — I498 Other specified cardiac arrhythmias: Secondary | ICD-10-CM

## 2011-05-11 DIAGNOSIS — I5022 Chronic systolic (congestive) heart failure: Secondary | ICD-10-CM

## 2011-05-11 DIAGNOSIS — R001 Bradycardia, unspecified: Secondary | ICD-10-CM | POA: Insufficient documentation

## 2011-05-11 DIAGNOSIS — I25810 Atherosclerosis of coronary artery bypass graft(s) without angina pectoris: Secondary | ICD-10-CM

## 2011-05-11 DIAGNOSIS — I2510 Atherosclerotic heart disease of native coronary artery without angina pectoris: Secondary | ICD-10-CM

## 2011-05-11 DIAGNOSIS — E785 Hyperlipidemia, unspecified: Secondary | ICD-10-CM

## 2011-05-11 LAB — BASIC METABOLIC PANEL
Calcium: 9.2 mg/dL (ref 8.4–10.5)
GFR: 33.55 mL/min — ABNORMAL LOW (ref >60.00)
Potassium: 4.5 mEq/L (ref 3.5–5.1)
Sodium: 143 mEq/L (ref 135–145)

## 2011-05-11 LAB — CBC WITH DIFFERENTIAL/PLATELET
Basophils Absolute: 0.1 10*3/uL (ref 0.0–0.1)
Eosinophils Relative: 2.9 % (ref 0.0–5.0)
HCT: 37.9 % — ABNORMAL LOW (ref 39.0–52.0)
Hemoglobin: 12.7 g/dL — ABNORMAL LOW (ref 13.0–17.0)
Lymphocytes Relative: 22.1 % (ref 12.0–46.0)
Lymphs Abs: 2.4 10*3/uL (ref 0.7–4.0)
Monocytes Relative: 7.4 % (ref 3.0–12.0)
Neutro Abs: 7.2 10*3/uL (ref 1.4–7.7)
RBC: 4.44 Mil/uL (ref 4.22–5.81)
WBC: 10.7 10*3/uL — ABNORMAL HIGH (ref 4.5–10.5)

## 2011-05-11 MED ORDER — CARVEDILOL 6.25 MG PO TABS: 6.2500 mg | ORAL_TABLET | Freq: Two times a day (BID) | ORAL | Status: DC

## 2011-05-11 NOTE — Assessment & Plan Note (Signed)
Stable without angina. Continue current medical program.

## 2011-05-11 NOTE — Assessment & Plan Note (Signed)
Lipids have been a dull with most recent LDL cholesterol 67 mg per deciliter. HDL has been very low.

## 2011-05-11 NOTE — Assessment & Plan Note (Signed)
The patient is stable with the Heart Association class II symptoms. He is markedly bradycardic today. His pulse had increased to 52 beats per minute when I examined him. I reviewed his medications and recommended that he reduce carvedilol from 12.5 mg twice daily to 6.25 mg twice daily. He will otherwise continue his medical program without changes. He is having no symptoms related to bradycardia at this time. We have had a discussion about pacing, cardiac resynchronization, and ICD therapy in the past and he has not been interested in any of these therapies.

## 2011-05-11 NOTE — Progress Notes (Signed)
HPI:  This is a 65 year old gentleman presenting for followup evaluation.  He is well known to me with history of ischemic cardiomyopathy after an acute inferoposterior MI in 2008. He required emergency multivessel CABG. The patient has declined cardiac resynchronization and ICD therapy.  Overall he is feeling well. He denies dyspnea, chest pain, orthopnea, PND, palpitations, lightheadedness, or syncope. He is inactive.  He complains of easy bruising. He's been compliant with his medications.  He was hospitalized several months ago at [facility] and was treated with a percutaneous drain in the gallbladder. The drain was ultimately removed and he has not required surgery. He reports no further abdominal pain.  Outpatient Encounter Prescriptions as of 05/11/2011  Medication Sig Dispense Refill  . aspirin 81 MG tablet Take 81 mg by mouth daily.        . carvedilol (COREG) 12.5 MG tablet take 1 tablet by mouth twice a day  60 tablet  6  . digoxin (LANOXIN) 0.125 MG tablet TAKE 1 TABLET BY MOUTH ONCE DAILY  30 tablet  6  . DIOVAN 40 MG tablet TAKE 1 TABLET BY MOUTH TWICE A DAY  60 tablet  5  . furosemide (LASIX) 80 MG tablet take 1 tablet by mouth once daily  30 tablet  7  . KLOR-CON M20 20 MEQ tablet TAKE 2 TABLETS ONCE DAILY  60 tablet  5  . simvastatin (ZOCOR) 40 MG tablet Take 1 tablet (40 mg total) by mouth every evening.  30 tablet  11    No Known Allergies  Past Medical History  Diagnosis Date  . CAD, ARTERY BYPASS GRAFT 2008  . CARDIOMYOPATHY, ISCHEMIC     has refused BiV ICD  . HYPERLIPIDEMIA-MIXED   . RENAL ARTERY STENOSIS   . Old MI (myocardial infarction) 2008    ROS: Negative except as per HPI  BP 91/57  Pulse 41  Ht 5\' 9"  (1.753 m)  Wt 75.932 kg (167 lb 6.4 oz)  BMI 24.72 kg/m2  PHYSICAL EXAM: Pt is alert and oriented, NAD HEENT: normal Neck: JVP - normal, carotids 2+= without bruits Lungs: CTA bilaterally CV: RRR without murmur or gallop Abd: soft, NT,  Positive BS, no hepatomegaly. There is a midline epigastric hernia easily reducible and nontender to palpation Ext: no C/C/E, distal pulses intact and equal Skin: warm/dry no rash  EKG:  Marked sinus bradycardia with first degree AV block 44 beats per minute, left bundle branch block.  ASSESSMENT AND PLAN:

## 2011-05-11 NOTE — Patient Instructions (Signed)
Your physician recommends that you schedule a follow-up appointment in: 3 months.   Your physician has recommended you make the following change in your medication: Decrease carvedilol to 6. 25 mg by mouth twice daily  You had lab work done today.

## 2011-06-19 ENCOUNTER — Other Ambulatory Visit: Payer: Self-pay | Admitting: Cardiovascular Disease

## 2011-07-04 ENCOUNTER — Other Ambulatory Visit: Payer: Self-pay | Admitting: Cardiovascular Disease

## 2011-07-27 ENCOUNTER — Telehealth: Payer: Self-pay | Admitting: Internal Medicine

## 2011-07-27 NOTE — Telephone Encounter (Signed)
Did not speak with patient. Spoke with physician at [facility] Patient being d/c'd.  Admitted with knee pain.  Was bradycardic, hypotensvie.  Was on pressors transiently Meds have been held.  Looks good per his report History of CHF ([facility] patient)  Needs quick f/u  Will arrange with someone next wk.

## 2011-07-31 ENCOUNTER — Telehealth: Payer: Self-pay | Admitting: Cardiovascular Disease

## 2011-07-31 ENCOUNTER — Ambulatory Visit: Payer: Medicare Other | Admitting: Nurse Practitioner

## 2011-07-31 NOTE — Telephone Encounter (Signed)
LOV faxed to [facility] @ [phone] 07/31/11/km

## 2011-08-08 ENCOUNTER — Ambulatory Visit (INDEPENDENT_AMBULATORY_CARE_PROVIDER_SITE_OTHER): Payer: Medicare Other | Admitting: Cardiovascular Disease

## 2011-08-08 ENCOUNTER — Encounter: Payer: Self-pay | Admitting: Cardiovascular Disease

## 2011-08-08 VITALS — BP 89/59 | HR 60 | Ht 69.0 in | Wt 162.0 lb

## 2011-08-08 DIAGNOSIS — I2510 Atherosclerotic heart disease of native coronary artery without angina pectoris: Secondary | ICD-10-CM

## 2011-08-08 DIAGNOSIS — E785 Hyperlipidemia, unspecified: Secondary | ICD-10-CM

## 2011-08-08 DIAGNOSIS — I25810 Atherosclerosis of coronary artery bypass graft(s) without angina pectoris: Secondary | ICD-10-CM

## 2011-08-08 DIAGNOSIS — I5022 Chronic systolic (congestive) heart failure: Secondary | ICD-10-CM

## 2011-08-08 LAB — BASIC METABOLIC PANEL
CO2: 26 mEq/L (ref 19–32)
Calcium: 8.7 mg/dL (ref 8.4–10.5)
Chloride: 107 mEq/L (ref 96–112)
Creatinine, Ser: 1.8 mg/dL — ABNORMAL HIGH (ref 0.4–1.5)
Glucose, Bld: 92 mg/dL (ref 70–99)

## 2011-08-08 LAB — LIPID PANEL
HDL: 61.5 mg/dL (ref >39.00)
LDL Cholesterol: 52 mg/dL (ref 0–99)
Total CHOL/HDL Ratio: 2
Triglycerides: 104 mg/dL (ref 0.0–149.0)

## 2011-08-08 LAB — HEPATIC FUNCTION PANEL
AST: 22 U/L (ref 0–37)
Albumin: 3.8 g/dL (ref 3.5–5.2)
Total Bilirubin: 0.6 mg/dL (ref 0.3–1.2)

## 2011-08-08 NOTE — Assessment & Plan Note (Signed)
Lipids and LFTs were checked today and they are at goal on simvastatin

## 2011-08-08 NOTE — Assessment & Plan Note (Signed)
Continue same program which includes an angiotensin receptor blocker, beta blocker, digoxin, Lasix, and aspirin. The concern that the patient may have had an interval stroke with his right facial droop now present. I have recommended bilateral carotid Doppler studies. His EKG does not show any evidence of atrial fib and he has always been in sinus rhythm on past evaluations. He would be at risk for cardioembolic event considering his severe LV dysfunction. He should continue on aspirin.

## 2011-08-08 NOTE — Progress Notes (Signed)
   HPI:  66 year old gentleman presenting for followup of chronic heart failure. He has advanced coronary artery disease with history of multiple myocardial infarctions. He has undergone multiple PCI procedures and was ultimately treated with coronary bypass surgery. His left ventricular ejection fraction is been in the range of 30% and he has declined cardiac resynchronization and/or ICD implantation.  The patient feels well. He denies chest pain or dyspnea. He denies edema, palpitations, lightheadedness, or syncope. He is not physically active.  Outpatient Encounter Prescriptions as of 08/08/2011  Medication Sig Dispense Refill  . aspirin 81 MG tablet Take 81 mg by mouth daily.        . carvedilol (COREG) 12.5 MG tablet Take 12.5 mg by mouth 2 (two) times daily with a meal.      . digoxin (LANOXIN) 0.125 MG tablet TAKE 1 TABLET BY MOUTH ONCE DAILY  30 tablet  6  . DIOVAN 40 MG tablet TAKE 1 TABLET BY MOUTH TWICE A DAY  60 tablet  5  . furosemide (LASIX) 80 MG tablet take 1 tablet by mouth once daily  30 tablet  7  . KLOR-CON M20 20 MEQ tablet TAKE 2 TABLETS ONCE DAILY  60 tablet  5  . simvastatin (ZOCOR) 40 MG tablet Take 1 tablet (40 mg total) by mouth every evening.  30 tablet  11  . DISCONTD: carvedilol (COREG) 6.25 MG tablet Take 1 tablet (6.25 mg total) by mouth 2 (two) times daily with a meal.  60 tablet  6    No Known Allergies  Past Medical History  Diagnosis Date  . CAD, ARTERY BYPASS GRAFT 2008  . CARDIOMYOPATHY, ISCHEMIC     has refused BiV ICD  . HYPERLIPIDEMIA-MIXED   . RENAL ARTERY STENOSIS   . Old MI (myocardial infarction) 2008    ROS: Negative except as per HPI  BP 89/59  Pulse 60  Ht 5\' 9"  (1.753 m)  Wt 73.483 kg (162 lb)  BMI 23.92 kg/m2  PHYSICAL EXAM: Pt is alert and oriented, NAD HEENT: Right facial droop is present without significant aphasia Neck: JVP - normal, carotids 2+= without bruits Lungs: CTA bilaterally CV: Bradycardic and regular without  murmur or gallop Abd: soft, NT, Positive BS, no hepatomegaly. There is an epigastric hernia present Ext: no C/C/E, distal pulses intact and equal Skin: warm/dry no rash Neuro: Strength is intact and equal bilaterally 5/5  EKG:  Sinus bradycardia 55 beats per minute with nonspecific intraventricular block.  ASSESSMENT AND PLAN:

## 2011-08-08 NOTE — Assessment & Plan Note (Signed)
The patient is stable without angina. He will continue on his same medical program.

## 2011-08-08 NOTE — Patient Instructions (Signed)
Your physician has requested that you have a carotid duplex. This test is an ultrasound of the carotid arteries in your neck. It looks at blood flow through these arteries that supply the brain with blood. Allow one hour for this exam. There are no restrictions or special instructions.  Your physician recommends that you have lab work today: BMP, LIVER and LIPID  Your physician wants you to follow-up in: 4 MONTHS. You will receive a reminder letter in the mail two months in advance. If you don't receive a letter, please call our office to schedule the follow-up appointment.  Your physician recommends that you continue on your current medications as directed. Please refer to the Current Medication list given to you today.

## 2011-08-15 ENCOUNTER — Encounter: Payer: Self-pay | Admitting: Cardiovascular Disease

## 2011-08-15 ENCOUNTER — Other Ambulatory Visit: Payer: Self-pay | Admitting: Cardiovascular Disease

## 2011-08-15 NOTE — Telephone Encounter (Signed)
New Problem: ° ° ° °Patient returned your call.  Please call back. °

## 2011-08-15 NOTE — Telephone Encounter (Signed)
This encounter was created in error - please disregard.

## 2011-09-04 ENCOUNTER — Encounter (INDEPENDENT_AMBULATORY_CARE_PROVIDER_SITE_OTHER): Payer: Medicare Other

## 2011-09-04 DIAGNOSIS — I5022 Chronic systolic (congestive) heart failure: Secondary | ICD-10-CM

## 2011-09-04 DIAGNOSIS — G459 Transient cerebral ischemic attack, unspecified: Secondary | ICD-10-CM

## 2011-09-04 DIAGNOSIS — I2510 Atherosclerotic heart disease of native coronary artery without angina pectoris: Secondary | ICD-10-CM

## 2011-09-16 ENCOUNTER — Other Ambulatory Visit: Payer: Self-pay | Admitting: Cardiovascular Disease

## 2011-12-18 ENCOUNTER — Other Ambulatory Visit: Payer: Self-pay | Admitting: Cardiovascular Disease

## 2012-01-15 ENCOUNTER — Other Ambulatory Visit: Payer: Self-pay | Admitting: Cardiovascular Disease

## 2012-01-22 ENCOUNTER — Ambulatory Visit (INDEPENDENT_AMBULATORY_CARE_PROVIDER_SITE_OTHER): Payer: Medicare Other | Admitting: Cardiovascular Disease

## 2012-01-22 ENCOUNTER — Encounter: Payer: Self-pay | Admitting: Cardiovascular Disease

## 2012-01-22 VITALS — BP 96/58 | HR 56 | Ht 69.0 in | Wt 161.1 lb

## 2012-01-22 DIAGNOSIS — M109 Gout, unspecified: Secondary | ICD-10-CM

## 2012-01-22 DIAGNOSIS — I5022 Chronic systolic (congestive) heart failure: Secondary | ICD-10-CM

## 2012-01-22 DIAGNOSIS — I25810 Atherosclerosis of coronary artery bypass graft(s) without angina pectoris: Secondary | ICD-10-CM

## 2012-01-22 DIAGNOSIS — E785 Hyperlipidemia, unspecified: Secondary | ICD-10-CM

## 2012-01-22 LAB — BASIC METABOLIC PANEL
CO2: 24 mEq/L (ref 19–32)
Chloride: 105 mEq/L (ref 96–112)
Glucose, Bld: 92 mg/dL (ref 70–99)
Potassium: 4.2 mEq/L (ref 3.5–5.1)
Sodium: 140 mEq/L (ref 135–145)

## 2012-01-22 MED ORDER — PREDNISONE 10 MG PO TABS: ORAL_TABLET | ORAL | Status: DC

## 2012-01-22 NOTE — Assessment & Plan Note (Signed)
Based on his history in current clinical symptoms, I suspect he has acute gout. Unfortunately, the patient has very limited resources and limited means to medical care. He does not have a primary care physician and he declined referral to an orthopedist. I'm going to write him a prescription for a tapering dose of prednisone over about 2 weeks.

## 2012-01-22 NOTE — Patient Instructions (Signed)
Your physician wants you to follow-up in: 4 MONTHS with Dr Excell Seltzer.  You will receive a reminder letter in the mail two months in advance. If you don't receive a letter, please call our office to schedule the follow-up appointment.  Your physician recommends that you have lab work today: BMP  Your physician has recommended you make the following change in your medication: START Prednisone 40mg  for 3 days, then 30mg  for 3 days, then 20 mg for 3 days, then 10mg  for 3 days, then you will stop medication  Please call our office if you would like to be referred to an Orthopaedic Physician.

## 2012-01-22 NOTE — Assessment & Plan Note (Signed)
Stable without anginal symptoms. Continue aspirin for antiplatelet therapy and simvastatin for lipid lowering.

## 2012-01-22 NOTE — Assessment & Plan Note (Signed)
Lipids reviewed and he has been at goal. Continue current medicines.

## 2012-01-22 NOTE — Progress Notes (Signed)
   HPI:  66 year old gentleman presenting for followup of chronic systolic heart failure. The patient has extensive CAD with history of multiple myocardial infarctions. He was treated with emergency coronary bypass surgery but had previously undergone PCI procedures. His left ventricular ejection fraction has been in the range of 30% and he has declined cardiac resynchronization and or ICD implantation.  The patient is in severe pain related to gout this morning. He has pain in his right shoulder, right hand, and left hip. He's had gout in the past and this is typical of his gout attacks. He was seen in the emergency room overnight. He does not have a primary care physician. He denies fevers, chills, or other systemic symptoms. He denies chest pain or pressure, dyspnea, edema, orthopnea, or PND.  Outpatient Encounter Prescriptions as of 01/22/2012  Medication Sig Dispense Refill  . aspirin 81 MG tablet Take 81 mg by mouth daily.        . carvedilol (COREG) 12.5 MG tablet Take 12.5 mg by mouth 2 (two) times daily with a meal.      . digoxin (LANOXIN) 0.125 MG tablet TAKE 1 TABLET BY MOUTH ONCE DAILY  30 tablet  6  . DIOVAN 40 MG tablet TAKE 1 TABLET BY MOUTH TWICE A DAY  60 tablet  5  . furosemide (LASIX) 80 MG tablet take 1 tablet by mouth once daily  30 tablet  7  . KLOR-CON M20 20 MEQ tablet TAKE 2 TABLETS ONCE DAILY  60 tablet  9  . simvastatin (ZOCOR) 40 MG tablet take 1 tablet by mouth daily every evening  30 tablet  11  . DISCONTD: carvedilol (COREG) 12.5 MG tablet take 1 tablet by mouth twice a day  60 tablet  6    No Known Allergies  Past Medical History  Diagnosis Date  . CAD, ARTERY BYPASS GRAFT 2008  . CARDIOMYOPATHY, ISCHEMIC     has refused BiV ICD  . HYPERLIPIDEMIA-MIXED   . RENAL ARTERY STENOSIS   . Old MI (myocardial infarction) 2008    ROS: Negative except as per HPI  BP 96/58  Pulse 56  Ht 5\' 9"  (1.753 m)  Wt 73.084 kg (161 lb 1.9 oz)  BMI 23.79 kg/m2  PHYSICAL  EXAM: Pt is alert and oriented, NAD HEENT: normal Neck: JVP - normal, carotids 2+= without bruits Lungs: CTA bilaterally CV: RRR without murmur or gallop Abd: soft, NT, Positive BS, no hepatomegaly Ext: no C/C/E of the lower extremities, distal pulses intact and equal. There is limited range of motion of the right shoulder. There is swelling of the right hand without point tenderness. Skin: warm/dry no rash  EKG:  Sinus bradycardia with first-degree AV block, left bundle branch block the  ASSESSMENT AND PLAN:

## 2012-01-22 NOTE — Assessment & Plan Note (Signed)
The patient has severe underlying ischemic cardiomyopathy. He has New York Heart Association class II symptoms, but physically he is very inactive. He will continue on a combination of carvedilol, Diovan, digoxin, and furosemide. We will check a metabolic panel today as he has chronic kidney disease the

## 2012-03-17 ENCOUNTER — Other Ambulatory Visit: Payer: Self-pay | Admitting: Cardiovascular Disease

## 2012-04-07 ENCOUNTER — Other Ambulatory Visit: Payer: Self-pay

## 2012-05-17 ENCOUNTER — Other Ambulatory Visit: Payer: Self-pay | Admitting: Cardiovascular Disease

## 2012-05-29 ENCOUNTER — Ambulatory Visit (INDEPENDENT_AMBULATORY_CARE_PROVIDER_SITE_OTHER): Payer: Medicare Other | Admitting: Cardiovascular Disease

## 2012-05-29 ENCOUNTER — Encounter: Payer: Self-pay | Admitting: Cardiovascular Disease

## 2012-05-29 VITALS — BP 100/50 | HR 49 | Ht 69.0 in | Wt 164.0 lb

## 2012-05-29 DIAGNOSIS — E785 Hyperlipidemia, unspecified: Secondary | ICD-10-CM

## 2012-05-29 DIAGNOSIS — I25810 Atherosclerosis of coronary artery bypass graft(s) without angina pectoris: Secondary | ICD-10-CM

## 2012-05-29 DIAGNOSIS — R0602 Shortness of breath: Secondary | ICD-10-CM

## 2012-05-29 NOTE — Patient Instructions (Addendum)
Your physician recommends that you schedule a follow-up appointment in: 4 months with Dr. Excell Seltzer.  Labs:  BMET and BNP at 4 month visit.

## 2012-05-29 NOTE — Progress Notes (Signed)
HPI:  67 year old gentleman presenting for followup of chronic systolic heart failure. The patient has extensive CAD with history of multiple myocardial infarctions. He was treated with emergency coronary bypass surgery but had previously undergone PCI procedures. His left ventricular ejection fraction has been in the range of 30% and he has declined cardiac resynchronization and or ICD implantation.  The patient reports no major changes in his symptoms. He continues to be short of breath with activity. He has to move much slower with certain activities and take rest breaks. He denies orthopnea, PND, edema, palpitations, lightheadedness, or syncope. He's had no chest pain or pressure. Shortness of breath is his main limiting symptom.  Outpatient Encounter Prescriptions as of 05/29/2012  Medication Sig Dispense Refill  . aspirin 81 MG tablet Take 81 mg by mouth daily.        . carvedilol (COREG) 12.5 MG tablet take 1 tablet by mouth twice a day  60 tablet  6  . digoxin (LANOXIN) 0.125 MG tablet TAKE 1 TABLET BY MOUTH ONCE DAILY  30 tablet  6  . DIOVAN 40 MG tablet TAKE 1 TABLET BY MOUTH TWICE A DAY  60 tablet  5  . furosemide (LASIX) 80 MG tablet take 1 tablet by mouth once daily  30 tablet  7  . KLOR-CON M20 20 MEQ tablet TAKE 2 TABLETS ONCE DAILY  60 tablet  9  . simvastatin (ZOCOR) 40 MG tablet take 1 tablet by mouth daily every evening  30 tablet  11  . [DISCONTINUED] predniSONE (DELTASONE) 10 MG tablet Take 4 tablets daily for 3 days, take 3 tablets daily for 3 days, take 2 tablets daily for 3 days, take one tablet daily for 3 days  30 tablet  0    No Known Allergies  Past Medical History  Diagnosis Date  . CAD, ARTERY BYPASS GRAFT 2008  . CARDIOMYOPATHY, ISCHEMIC     has refused BiV ICD  . HYPERLIPIDEMIA-MIXED   . RENAL ARTERY STENOSIS   . Old MI (myocardial infarction) 2008    ROS: Negative except as per HPI  BP 100/50  Pulse 49  Ht 5\' 9"  (1.753 m)  Wt 74.39 kg (164 lb)  BMI  24.22 kg/m2  PHYSICAL EXAM: Pt is alert and oriented, NAD HEENT: normal Neck: JVP - normal, carotids 2+= without bruits Lungs: CTA bilaterally CV: RRR without murmur or gallop, distant heart sounds Abd: soft, NT, Positive BS, abdominal hernia is easily reducible and nontender Ext: Trace pretibial edema, distal pulses intact and equal Skin: warm/dry no rash  EKG:  Sinus bradycardia with first degree AV block, left bundle branch block, heart rate 53 beats per minute  ASSESSMENT AND PLAN: 1. CAD status post CABG. The patient should remain on his current medical program with secondary risk reduction measures. He is having no anginal symptoms.  2. Chronic systolic heart failure, New York Heart Association class 2-3. He continues to be symptomatic on good medical therapy. I have not added Spirinolactone to his medical regimen because of chronic kidney disease. I again discussed consideration of a biventricular ICD and he is willing to consider this but is not ready to move forward with the consult. He tells me he will think about it further and discuss it when he returns for followup in 4 months.  3. Hyperlipidemia. He remains on simvastatin. LFTs last year were normal. Last cholesterol 2013 was 134 with an HDL 62 and LDL 52.  4. Chronic kidney disease, stage III. Continue ARB.  Repeat labs when he returns for followup in 4 months.  Tonny Bollman 05/29/2012 10:41 AM

## 2012-06-17 ENCOUNTER — Other Ambulatory Visit: Payer: Self-pay | Admitting: Cardiovascular Disease

## 2012-07-18 ENCOUNTER — Other Ambulatory Visit: Payer: Self-pay | Admitting: Cardiovascular Disease

## 2012-08-21 DIAGNOSIS — K439 Ventral hernia without obstruction or gangrene: Secondary | ICD-10-CM | POA: Insufficient documentation

## 2012-08-21 DIAGNOSIS — F17200 Nicotine dependence, unspecified, uncomplicated: Secondary | ICD-10-CM | POA: Insufficient documentation

## 2012-08-21 DIAGNOSIS — I2510 Atherosclerotic heart disease of native coronary artery without angina pectoris: Secondary | ICD-10-CM | POA: Insufficient documentation

## 2012-08-22 DIAGNOSIS — I440 Atrioventricular block, first degree: Secondary | ICD-10-CM | POA: Insufficient documentation

## 2012-08-22 DIAGNOSIS — I447 Left bundle-branch block, unspecified: Secondary | ICD-10-CM | POA: Insufficient documentation

## 2012-09-15 ENCOUNTER — Other Ambulatory Visit: Payer: Self-pay | Admitting: Cardiovascular Disease

## 2012-09-18 ENCOUNTER — Other Ambulatory Visit: Payer: Self-pay

## 2012-09-18 DIAGNOSIS — D649 Anemia, unspecified: Secondary | ICD-10-CM | POA: Insufficient documentation

## 2012-09-18 DIAGNOSIS — N183 Chronic kidney disease, stage 3 unspecified: Secondary | ICD-10-CM | POA: Insufficient documentation

## 2012-09-18 MED ORDER — SIMVASTATIN 40 MG PO TABS: ORAL_TABLET | ORAL | Status: DC

## 2012-09-18 MED ORDER — VALSARTAN 40 MG PO TABS: ORAL_TABLET | ORAL | Status: DC

## 2012-09-21 DIAGNOSIS — E539 Vitamin B deficiency, unspecified: Secondary | ICD-10-CM | POA: Insufficient documentation

## 2012-09-26 ENCOUNTER — Other Ambulatory Visit (INDEPENDENT_AMBULATORY_CARE_PROVIDER_SITE_OTHER): Payer: Medicare Other

## 2012-09-26 ENCOUNTER — Ambulatory Visit (INDEPENDENT_AMBULATORY_CARE_PROVIDER_SITE_OTHER): Payer: Medicare Other | Admitting: Cardiovascular Disease

## 2012-09-26 ENCOUNTER — Encounter: Payer: Self-pay | Admitting: Cardiovascular Disease

## 2012-09-26 VITALS — BP 100/60 | HR 66 | Ht 69.0 in | Wt 163.0 lb

## 2012-09-26 DIAGNOSIS — I5022 Chronic systolic (congestive) heart failure: Secondary | ICD-10-CM

## 2012-09-26 DIAGNOSIS — R0602 Shortness of breath: Secondary | ICD-10-CM

## 2012-09-26 DIAGNOSIS — I25810 Atherosclerosis of coronary artery bypass graft(s) without angina pectoris: Secondary | ICD-10-CM

## 2012-09-26 DIAGNOSIS — E7800 Pure hypercholesterolemia, unspecified: Secondary | ICD-10-CM

## 2012-09-26 DIAGNOSIS — R0989 Other specified symptoms and signs involving the circulatory and respiratory systems: Secondary | ICD-10-CM

## 2012-09-26 LAB — BASIC METABOLIC PANEL
BUN: 32 mg/dL — ABNORMAL HIGH (ref 6–23)
Chloride: 107 mEq/L (ref 96–112)
GFR: 35.73 mL/min — ABNORMAL LOW (ref >60.00)
Potassium: 4 mEq/L (ref 3.5–5.1)
Sodium: 142 mEq/L (ref 135–145)

## 2012-09-26 LAB — LIPID PANEL
Cholesterol: 105 mg/dL (ref 0–200)
HDL: 42.4 mg/dL (ref >39.00)
Triglycerides: 77 mg/dL (ref 0.0–149.0)
VLDL: 15.4 mg/dL (ref 0.0–40.0)

## 2012-09-26 LAB — BRAIN NATRIURETIC PEPTIDE: Pro B Natriuretic peptide (BNP): 284 pg/mL — ABNORMAL HIGH (ref 0.0–100.0)

## 2012-09-26 LAB — HEPATIC FUNCTION PANEL
ALT: 15 U/L (ref 0–53)
AST: 12 U/L (ref 0–37)
Albumin: 3.4 g/dL — ABNORMAL LOW (ref 3.5–5.2)
Total Protein: 6.6 g/dL (ref 6.0–8.3)

## 2012-09-26 NOTE — Progress Notes (Signed)
   HPI:  67 year old gentleman presenting for followup of chronic systolic heart failure. The patient has extensive CAD with history of multiple myocardial infarctions. He was treated with emergency coronary bypass surgery but had previously undergone PCI procedures. His left ventricular ejection fraction has been in the range of 30% and he has declined cardiac resynchronization and or ICD implantation.  The patient is doing well. He denies chest pain, dyspnea, edema, or palpitations. He's had a flareup of gout involving his left knee. He was seen in the emergency room recently and was started on some medication for gout. He is doing better since then. He denies lightheadedness, palpitations, or syncope.  Outpatient Encounter Prescriptions as of 09/26/2012  Medication Sig Dispense Refill  . aspirin 81 MG tablet Take 81 mg by mouth daily.        . carvedilol (COREG) 12.5 MG tablet take 1 tablet by mouth twice a day  60 tablet  6  . DIGOX 0.125 MG tablet TAKE 1 TABLET BY MOUTH ONCE DAILY  30 tablet  6  . furosemide (LASIX) 80 MG tablet take 1 tablet by mouth once daily  30 tablet  7  . KLOR-CON M20 20 MEQ tablet TAKE 2 TABLETS ONCE DAILY  60 tablet  9  . simvastatin (ZOCOR) 40 MG tablet take 1 tablet by mouth daily every evening  90 tablet  1  . valsartan (DIOVAN) 40 MG tablet TAKE 1 TABLET BY MOUTH TWICE A DAY  180 tablet  1   No facility-administered encounter medications on file as of 09/26/2012.    No Known Allergies  Past Medical History  Diagnosis Date  . CAD, ARTERY BYPASS GRAFT 2008  . CARDIOMYOPATHY, ISCHEMIC     has refused BiV ICD  . HYPERLIPIDEMIA-MIXED   . RENAL ARTERY STENOSIS   . Old MI (myocardial infarction) 2008    ROS: Negative except as per HPI  BP 100/60  Pulse 66  Ht 5\' 9"  (1.753 m)  Wt 73.936 kg (163 lb)  BMI 24.06 kg/m2  SpO2 99%  PHYSICAL EXAM: Pt is alert and oriented, NAD HEENT: normal Neck: JVP - normal, carotids 2+= without bruits Lungs: CTA  bilaterally CV: RRR without murmur or gallop Abd: soft, NT, Positive BS, midline hernia noted, easily reducible and nontender Ext: no C/C/E, distal pulses intact and equal Skin: warm/dry no rash  ASSESSMENT AND PLAN: 1. Severe ischemic cardiomyopathy with chronic systolic heart failure, New York Heart Association class II. He seems to have limited symptoms but I think he is very sedentary. He is compliant with his medicines. We discussed CRT-D once again today, the patient clearly is not inclined to move forward with this referral. Will continue to manage her medically.  2. Coronary artery disease, native vessel. He is status post emergency bypass surgery several years ago in the setting of an acute MI. He has no anginal symptoms. He's on aspirin, beta blocker, and statin drug.  3. Hyperlipidemia. Lipids from March 2013 reviewed. Will repeat today.  Her followup will see him back in 6 months. Will check a metabolic panel with LFTs, lipids, and a BNP today.  Tonny Bollman 09/26/2012 9:19 AM

## 2012-09-26 NOTE — Patient Instructions (Addendum)
Your physician recommends that you have lab work today: LIPID, LIVER, BMP and BNP  Your physician wants you to follow-up in: 6 MONTHS with Dr Excell Seltzer. You will receive a reminder letter in the mail two months in advance. If you don't receive a letter, please call our office to schedule the follow-up appointment.  Your physician recommends that you continue on your current medications as directed. Please refer to the Current Medication list given to you today.

## 2012-09-29 ENCOUNTER — Telehealth: Payer: Self-pay | Admitting: Cardiovascular Disease

## 2012-09-29 NOTE — Telephone Encounter (Signed)
Follow Up ° ° ° ° ° °Pt calling in returning phone call from earlier. Please call. °

## 2012-10-09 DIAGNOSIS — M25519 Pain in unspecified shoulder: Secondary | ICD-10-CM | POA: Insufficient documentation

## 2012-10-09 DIAGNOSIS — M109 Gout, unspecified: Secondary | ICD-10-CM | POA: Insufficient documentation

## 2012-10-15 ENCOUNTER — Other Ambulatory Visit: Payer: Self-pay | Admitting: Cardiovascular Disease

## 2012-10-22 DIAGNOSIS — M79609 Pain in unspecified limb: Secondary | ICD-10-CM | POA: Insufficient documentation

## 2012-10-22 DIAGNOSIS — M109 Gout, unspecified: Secondary | ICD-10-CM | POA: Insufficient documentation

## 2012-10-22 DIAGNOSIS — I959 Hypotension, unspecified: Secondary | ICD-10-CM | POA: Insufficient documentation

## 2012-10-22 DIAGNOSIS — N400 Enlarged prostate without lower urinary tract symptoms: Secondary | ICD-10-CM | POA: Insufficient documentation

## 2012-10-23 DIAGNOSIS — N184 Chronic kidney disease, stage 4 (severe): Secondary | ICD-10-CM | POA: Insufficient documentation

## 2013-03-17 ENCOUNTER — Other Ambulatory Visit: Payer: Self-pay | Admitting: Cardiovascular Disease

## 2013-03-25 ENCOUNTER — Ambulatory Visit (INDEPENDENT_AMBULATORY_CARE_PROVIDER_SITE_OTHER): Payer: Medicare Other | Admitting: Cardiovascular Disease

## 2013-03-25 ENCOUNTER — Encounter: Payer: Self-pay | Admitting: Cardiovascular Disease

## 2013-03-25 VITALS — BP 112/70 | HR 58 | Ht 69.0 in | Wt 169.0 lb

## 2013-03-25 DIAGNOSIS — E785 Hyperlipidemia, unspecified: Secondary | ICD-10-CM

## 2013-03-25 DIAGNOSIS — I25810 Atherosclerosis of coronary artery bypass graft(s) without angina pectoris: Secondary | ICD-10-CM

## 2013-03-25 NOTE — Patient Instructions (Signed)
Your physician wants you to follow-up in: 6 MONTHS with Dr Cooper.  You will receive a reminder letter in the mail two months in advance. If you don't receive a letter, please call our office to schedule the follow-up appointment.  Your physician recommends that you continue on your current medications as directed. Please refer to the Current Medication list given to you today.  

## 2013-03-25 NOTE — Progress Notes (Signed)
    HPI:  67 year old gentleman presenting for followup of chronic systolic heart failure. The patient has extensive CAD with history of multiple myocardial infarctions. He was treated with emergency coronary bypass surgery but had previously undergone PCI procedures. His left ventricular ejection fraction has been in the range of 30% and he has declined cardiac resynchronization and or ICD implantation. He presents today for six-month followup evaluation.  He's doing well today. He quit smoking 2 months ago. He has shortness of breath with activity but this is long-standing. With rest or low-level activity she has no symptoms. He denies orthopnea, PND, chest pain, or leg swelling. He's had no lightheadedness, palpitations, or syncope. He's had some problems with gout but since starting allopurinol he's been asymptomatic. His primary care physician has been following his lab work.   Outpatient Encounter Prescriptions as of 03/25/2013  Medication Sig  . allopurinol (ZYLOPRIM) 100 MG tablet 100 mg. TAKE ONE DAILY  . aspirin 81 MG tablet Take 81 mg by mouth daily.    . carvedilol (COREG) 12.5 MG tablet take 1 tablet by mouth twice a day  . digoxin (LANOXIN) 0.125 MG tablet take 1 tablet by mouth once daily  . furosemide (LASIX) 80 MG tablet take 1 tablet by mouth once daily  . KLOR-CON M20 20 MEQ tablet TAKE 2 TABLETS ONCE DAILY  . simvastatin (ZOCOR) 40 MG tablet take 1 tablet by mouth daily every evening  . valsartan (DIOVAN) 40 MG tablet TAKE 1 TABLET BY MOUTH TWICE A DAY    No Known Allergies  Past Medical History  Diagnosis Date  . CAD, ARTERY BYPASS GRAFT 2008  . CARDIOMYOPATHY, ISCHEMIC     has refused BiV ICD  . HYPERLIPIDEMIA-MIXED   . RENAL ARTERY STENOSIS   . Old MI (myocardial infarction) 2008    ROS: Negative except as per HPI  BP 112/70  Pulse 58  Ht 5\' 9"  (1.753 m)  Wt 169 lb (76.658 kg)  BMI 24.95 kg/m2  SpO2 98%  PHYSICAL EXAM: Pt is alert and oriented,  NAD HEENT: normal Neck: JVP - normal, carotids 2+= without bruits Lungs: Diminished air movement bilaterally CV: RRR without murmur or gallop Abd: soft, NT, Positive BS, no hepatomegaly Ext: no C/C/E, distal pulses intact and equal Skin: warm/dry no rash  EKG:  Normal sinus rhythm 58 beats per minute, first degree AV block, left bundle branch block.  ASSESSMENT AND PLAN: 1. Chronic systolic heart failure secondary to severe ischemic cardiomyopathy. The patient has New York Heart Association class II symptoms. I think his shortness of breath is multifactorial with a long-standing heavy smoking history. He will continue on his current medical program without changes. I will see him back in 6 months.  2. Coronary artery disease, native vessel. The patient is status post CABG. He remains on aspirin 81 mg and simvastatin 40 mg for risk reduction. He is on a beta blocker and ARB. As above, he has stopped smoking. He has no anginal symptoms.  3. Hypertension. Blood pressure well controlled on carvedilol and valsartan.  4. Hyperlipidemia. Patient is on a statin drug. Lipids from May 2014 show a cholesterol of 105, triglycerides 77, HDL 42, and LDL 47.   5. Tobacco abuse, long-standing. The patient has quit smoking and he was encouraged to continue to abstain from cigarettes.  Tonny Bollman 03/25/2013 9:45 AM

## 2013-04-19 ENCOUNTER — Other Ambulatory Visit: Payer: Self-pay | Admitting: Cardiovascular Disease

## 2013-04-22 ENCOUNTER — Other Ambulatory Visit: Payer: Self-pay | Admitting: Cardiovascular Disease

## 2013-05-17 ENCOUNTER — Other Ambulatory Visit: Payer: Self-pay | Admitting: Cardiovascular Disease

## 2013-09-04 ENCOUNTER — Ambulatory Visit: Payer: Medicare Other | Admitting: Cardiovascular Disease

## 2013-10-01 ENCOUNTER — Ambulatory Visit: Payer: Medicare Other | Admitting: Cardiovascular Disease

## 2013-10-25 ENCOUNTER — Other Ambulatory Visit: Payer: Self-pay | Admitting: Cardiovascular Disease

## 2013-11-25 ENCOUNTER — Telehealth: Payer: Self-pay | Admitting: Cardiovascular Disease

## 2013-11-25 NOTE — Telephone Encounter (Signed)
Spoke with Olegario Messierkathy at [facility], okay given for pt to change to valsartan.

## 2013-11-25 NOTE — Telephone Encounter (Signed)
New message     Can pt have generic diovan because ins will not pay for name brand?

## 2014-01-19 ENCOUNTER — Other Ambulatory Visit: Payer: Self-pay | Admitting: Cardiology

## 2014-02-15 ENCOUNTER — Other Ambulatory Visit: Payer: Self-pay | Admitting: Cardiovascular Disease

## 2014-02-22 ENCOUNTER — Other Ambulatory Visit: Payer: Self-pay | Admitting: Cardiovascular Disease

## 2014-05-19 ENCOUNTER — Encounter: Payer: Self-pay | Admitting: *Deleted

## 2014-05-19 ENCOUNTER — Other Ambulatory Visit: Payer: Self-pay | Admitting: Cardiovascular Disease

## 2014-07-29 ENCOUNTER — Other Ambulatory Visit: Payer: Self-pay

## 2014-07-29 MED ORDER — VALSARTAN 40 MG PO TABS: 40.0000 mg | ORAL_TABLET | Freq: Two times a day (BID) | ORAL | Status: DC

## 2014-08-18 ENCOUNTER — Encounter: Payer: Self-pay | Admitting: Cardiovascular Disease

## 2014-08-18 ENCOUNTER — Ambulatory Visit (INDEPENDENT_AMBULATORY_CARE_PROVIDER_SITE_OTHER): Payer: Medicare Other | Admitting: Cardiovascular Disease

## 2014-08-18 VITALS — BP 110/80 | HR 53 | Ht 69.0 in | Wt 176.4 lb

## 2014-08-18 DIAGNOSIS — I10 Essential (primary) hypertension: Secondary | ICD-10-CM | POA: Diagnosis not present

## 2014-08-18 DIAGNOSIS — E785 Hyperlipidemia, unspecified: Secondary | ICD-10-CM

## 2014-08-18 DIAGNOSIS — I5022 Chronic systolic (congestive) heart failure: Secondary | ICD-10-CM | POA: Diagnosis not present

## 2014-08-18 MED ORDER — POTASSIUM CHLORIDE CRYS ER 20 MEQ PO TBCR: 20.0000 meq | EXTENDED_RELEASE_TABLET | Freq: Two times a day (BID) | ORAL | Status: DC

## 2014-08-18 MED ORDER — FUROSEMIDE 80 MG PO TABS: 80.0000 mg | ORAL_TABLET | Freq: Two times a day (BID) | ORAL | Status: DC

## 2014-08-18 NOTE — Progress Notes (Signed)
Cardiology Office Note   Date:  08/18/2014   ID:  Nicholas Stanley, DOB 07/06/1945, MRN [id number]  PCP:  Alois ClicheSohail N Bazel, MD  Cardiologist:  Tonny BollmanMichael Claribel Sachs, MD    No chief complaint on file.    History of Present Illness: Nicholas Stanley is a 69 y.o. male who presents for follow-up of chronic systolic heart failure. The patient has extensive CAD with history of multiple myocardial infarctions. He was treated with emergency coronary bypass surgery but had previously undergone PCI procedures. His left ventricular ejection fraction has been in the range of 30% and he has declined cardiac resynchronization and or ICD implantation. He was last seen in November 2014.  The patient has been significantly limited by shortness of breath. This is been progressive over several months. He was [facility]. He was given intravenous Lasix and his oral Lasix was increased for a short period of time. He had transient improvement, but continues with significant swelling and shortness of breath. He reports New York Heart Association functional class III symptoms, with shortness of breath involving low level activities. His abdomen and legs are swollen. He denies lightheadedness or syncope. He denies chest pain. He does complain of generalized weakness. He has been compliant with his medications.   Past Medical History  Diagnosis Date  . CAD, ARTERY BYPASS GRAFT 2008  . CARDIOMYOPATHY, ISCHEMIC     has refused BiV ICD  . HYPERLIPIDEMIA-MIXED   . RENAL ARTERY STENOSIS   . Old MI (myocardial infarction) 2008    Past Surgical History  Procedure Laterality Date  . Coronary artery bypass graft  2008  . Mitral valve repair  2008    Current Outpatient Prescriptions  Medication Sig Dispense Refill  . allopurinol (ZYLOPRIM) 100 MG tablet 100 mg. TAKE ONE DAILY    . aspirin 81 MG tablet Take 81 mg by mouth daily.      . carvedilol (COREG) 12.5  MG tablet take 1 tablet by mouth twice a day 60 tablet 0  . DIGITEK 125 MCG tablet take 1 tablet by mouth once daily 30 tablet 0  . furosemide (LASIX) 80 MG tablet Take 1 tablet (80 mg total) by mouth 2 (two) times daily. 60 tablet 11  . potassium chloride SA (KLOR-CON M20) 20 MEQ tablet Take 1 tablet (20 mEq total) by mouth 2 (two) times daily. 60 tablet 11  . simvastatin (ZOCOR) 40 MG tablet take 1 tablet by mouth daily every evening 90 tablet 1  . valsartan (DIOVAN) 40 MG tablet Take 1 tablet (40 mg total) by mouth 2 (two) times daily. 180 tablet 0   No current facility-administered medications for this visit.    Allergies:   Review of patient's allergies indicates no known allergies.   Social History:  The patient  reports that he has quit smoking. He does not have any smokeless tobacco history on file. He reports that he does not drink alcohol or use illicit drugs.   Family History:  The patient's  family history includes Cancer in his paternal grandfather; Coronary artery disease in his other; Diabetes in his brother; Heart attack in his father; Stroke in his mother.    ROS:  Please see the history of present illness.  Otherwise, review of systems is positive for leg swelling, exertional dyspnea, easy bruising.  All other systems are reviewed and negative.    PHYSICAL EXAM: VS:  BP 110/80 mmHg  Pulse 53  Ht   (1.753 m)  Wt 176 lb 6.4 oz (80.015 kg)  BMI 26.04 kg/m2 , BMI Body mass index is 26.04 kg/(m^2). GEN: Chronically ill-appearing male, in no acute distress HEENT: normal except for poor dentition Neck: Positive jugular venous distention. no masses. No carotid bruits Cardiac: RRR without murmur or gallop   distant heart sounds             Respiratory:  clear to auscultation bilaterally, normal work of breathing GI: soft, nontender, mild distention without tenderness MS: no deformity or atrophy Ext: 2+ pretibial edema Skin: warm and dry, no rash Neuro:  Strength and  sensation are intact Psych: euthymic mood, full affect  EKG:  EKG is ordered today. The ekg ordered today shows sinus bradycardia with first-degree AV block 53 bpm, left bundle branch block.  Recent Labs: No results found for requested labs within last 365 days.   Lipid Panel     Component Value Date/Time   CHOL 105 09/26/2012 0925   TRIG 77.0 09/26/2012 0925   HDL 42.40 09/26/2012 0925   CHOLHDL 2 09/26/2012 0925   VLDL 15.4 09/26/2012 0925   LDLCALC 47 09/26/2012 0925   LDLDIRECT 91.2 03/22/2009 1119      Wt Readings from Last 3 Encounters:  08/18/14 176 lb 6.4 oz (80.015 kg)  03/25/13 169 lb (76.658 kg)  09/26/12 163 lb (73.936 kg)    ASSESSMENT AND PLAN: 1. Acute on Chronic systolic heart failure, New York Heart Association functional class IIIB. The patient has progressive symptoms of congestive heart failure. I recommended that he increase furosemide to 80 mg twice daily. Also advised that he increase his potassium chloride. He should return for close follow-up with a metabolic panel and PA/NP office visit. He should otherwise continue his current medications. Advised that if his symptoms progress, he needs to come to the emergency department for hospital admission. If he does not respond well to increased oral furosemide, I suspect he will require hospitalization with consideration of IV diuresis and inotropic therapy. I'm not sure that he would be a candidate for advanced heart failure therapies. He has not been interested in an ICD in the past.  2. Coronary artery disease, native vessel: No symptoms of angina. Continue current medical program.  3. Essential hypertension: Blood pressure is controlled. He will increase furosemide.  4. Hyperlipidemia: Treated with simvastatin 40 mg daily.  5. Chronic kidney disease, stage III: I reviewed his lab work from [facility] and his creatinine has been stable in the range of 1. 82.0  mg/dL.  Current medicines are reviewed with the patient today.  The patient does not have concerns regarding medicines.  The following changes have been made:  See above  Labs/ tests ordered today include:   Orders Placed This Encounter  Procedures  . Basic Metabolic Panel (BMET)  . EKG 12-Lead    Disposition:   FU 2 weeks with lab work and a PA/NP visit  Signed, Tonny Bollman, MD  08/18/2014 11:31 PM    [facility] [address] [place], [place], [place]  [address] Phone: [phone]; Fax: [phone]

## 2014-08-18 NOTE — Patient Instructions (Addendum)
Your physician has recommended you make the following change in your medication:   1. INCREASE Lasix to 80 mg take one tablet by mouth twice daily 2. INCREASE Potassium Chloride to 20 mEq take one tablet by mouth twice daily  Your physician recommends that you return for lab work in: 2 weeks for BMET.  Your physician recommends that you schedule a follow-up appointment in:2 weeks with a PA.  If symptoms get worse please go to emergency department for evaluation.

## 2014-08-29 NOTE — Progress Notes (Signed)
Cardiology Office Note   Date:  08/30/2014   ID:  Nicholas Stanley, DOB 12/07/1945, MRN [id number]  PCP:  Alois ClicheSohail N Bazel, MD  Cardiologist:  Dr. Tonny BollmanMichael Cooper     Chief Complaint  Patient presents with  . Congestive Heart Failure     History of Present Illness: Nicholas SorrowDexter Erler is a 69 y.o. male with a hx of CAD, status post emergent CABG 02/2007, ischemic cardiomyopathy, systolic CHF, HTN, CKD, HL, renal artery stenosis. Patient has declined CRT or ICD in the past. Recently seen by Dr. Excell Seltzerooper 08/18/14 after an 18 month hiatus.  He had recently been seen [facility]. He was given IV Lasix.  Patient continued to demonstrate evidence of acute on chronic systolic CHF when he saw Dr. Excell Seltzerooper. Lasix was adjusted.  He returns for close FU.    He is feeling better.  Notes he is always SOB but his breathing is improved.  He is currently NYHA 2b-3.  Denies orthopnea, PND.  LE edema is improved.  Denies chest pain, abdominal pain (anginal equivalent), syncope.    Studies/Reports Reviewed Today:   Carotid US 08/2011 Bilateral ICA 0-39% >> FU 1 year   Echo 09/2007 EF 20%, inferoposterior AK, apical inferoseptal AK, restrictive physiology AV with restricted motion of left coronary cusp Fibrocalcific change of aortic root Mitral ring annuloplasty with mild-moderate MR, mean gradient 3 mmHg Moderate LAE Mild RVE, moderately reduced RVSF, peak RVSP moderately increased Moderately increased PASP Moderate TR Mild RAE   Cardiac cath 02/24/07 LM: Ruptured plaque in the ostium/proximal portion, distal 20-30% LAD: Stents in proximal and mid and D1 patent LCx: Mid stent patent RCA: Proximal ulcerated plaque 50%, distal occluded EF: 20-30%  Past Medical History  Diagnosis Date  . CAD, ARTERY BYPASS GRAFT 2008  . CARDIOMYOPATHY, ISCHEMIC     has refused BiV ICD  . HYPERLIPIDEMIA-MIXED   . RENAL ARTERY STENOSIS   . Old MI (myocardial infarction) 2008     Past Surgical History  Procedure Laterality Date  . Coronary artery bypass graft  2008  . Mitral valve repair  2008     Current Outpatient Prescriptions  Medication Sig Dispense Refill  . allopurinol (ZYLOPRIM) 100 MG tablet Take 100 mg by mouth daily. TAKE TWO  DAILY    . aspirin 81 MG tablet Take 81 mg by mouth daily.      . carvedilol (COREG) 12.5 MG tablet take 1 tablet by mouth twice a day 60 tablet 0  . DIGITEK 125 MCG tablet take 1 tablet by mouth once daily 30 tablet 0  . furosemide (LASIX) 80 MG tablet Take 1 tablet (80 mg total) by mouth 2 (two) times daily. 60 tablet 11  . potassium chloride SA (KLOR-CON M20) 20 MEQ tablet Take 1 tablet (20 mEq total) by mouth 2 (two) times daily. 60 tablet 11  . simvastatin (ZOCOR) 40 MG tablet take 1 tablet by mouth daily every evening 90 tablet 1  . valsartan (DIOVAN) 40 MG tablet Take 1 tablet (40 mg total) by mouth 2 (two) times daily. 180 tablet 0   No current facility-administered medications for this visit.    Allergies:   Review of patient's allergies indicates no known allergies.    Social History:  The patient  reports that he has quit smoking. He does not have any smokeless tobacco history on file. He reports that he does not drink alcohol or use illicit drugs.   Family History:  The patient's family history includes Cancer in his paternal grandfather; Coronary artery disease in his other; Diabetes in his brother; Heart attack in his father; Stroke in his mother.    ROS:   Please see the history of present illness.   Review of Systems  Cardiovascular: Positive for dyspnea on exertion.  Hematologic/Lymphatic: Bruises/bleeds easily.  All other systems reviewed and are negative.    PHYSICAL EXAM: VS:  BP 92/70 mmHg  Pulse 60  Ht  (1.753 m)  Wt 170 lb (77.111 kg)  BMI 25.09 kg/m2    Wt Readings from Last 3 Encounters:  08/30/14 170 lb (77.111 kg)  08/18/14 176 lb 6.4 oz (80.015 kg)  03/25/13 169 lb (76.658  kg)     GEN: Well nourished, well developed, in no acute distress HEENT: normal Neck: no JVD, no masses Cardiac:  Normal S1/S2, RRR; 2/6 systolic murmur RUSB, no rubs or gallops, 2+ ankle edema  Respiratory:  Decreased breath sounds bilaterally, faint crackles at bases L > R. GI: soft, nontender, nondistended, + BS MS: no deformity or atrophy Skin: warm and dry  Neuro:  CNs II-XII intact, Strength and sensation are intact Psych: Normal affect   EKG:  EKG is not ordered today.  It demonstrates:   N/a    Recent Labs: No results found for requested labs within last 365 days.    Lipid Panel    Component Value Date/Time   CHOL 105 09/26/2012 0925   TRIG 77.0 09/26/2012 0925   HDL 42.40 09/26/2012 0925   CHOLHDL 2 09/26/2012 0925   VLDL 15.4 09/26/2012 0925   LDLCALC 47 09/26/2012 0925   LDLDIRECT 91.2 03/22/2009 1119      ASSESSMENT AND PLAN:  Chronic systolic CHF (congestive heart failure) He is improved.  Still has a little way to go with diuresis.  Current dose of Lasix should be sufficient.  Will obtain FU BMET, Mg2+ today.    Ischemic cardiomyopathy He has not had an echo in several years.  He has a notable systolic murmur.  Obtain an echo.  Continue current dose of Coreg, Diovan, Digoxin.  Coronary artery disease involving native coronary artery of native heart without angina pectoris No angina.  Continue ASA, statin, beta-blocker, angiotensin receptor blocker.  Hyperlipidemia Continue statin Rx.  Essential hypertension Controlled.  BP somewhat low but he is asymptomatic.   Chronic kidney disease, stage III (moderate)  Arrange FU BMET today.    Current medicines are reviewed at length with the patient today.  The patient does not have concerns regarding medicines.  The following changes have been made:  no change   Labs/ tests ordered today include:  Orders Placed This Encounter  Procedures  . Basic Metabolic Panel (BMET)  . Magnesium  . 2D  Echocardiogram with contrast    Disposition:   FU with me on a day Dr. Tonny Bollman in office in 3-4 weeks.    Signed, Brynda Rim, MHS 08/30/2014 8:26 AM    [facility] [address] [place], [place], [place]  [address] Phone: [phone]; Fax: [phone]

## 2014-08-30 ENCOUNTER — Ambulatory Visit (INDEPENDENT_AMBULATORY_CARE_PROVIDER_SITE_OTHER): Payer: Medicare Other | Admitting: Physician Assistant

## 2014-08-30 ENCOUNTER — Encounter: Payer: Self-pay | Admitting: Physician Assistant

## 2014-08-30 ENCOUNTER — Other Ambulatory Visit: Payer: Medicare Other

## 2014-08-30 ENCOUNTER — Telehealth: Payer: Self-pay | Admitting: *Deleted

## 2014-08-30 VITALS — BP 92/70 | HR 60 | Ht 69.0 in | Wt 170.0 lb

## 2014-08-30 DIAGNOSIS — I2510 Atherosclerotic heart disease of native coronary artery without angina pectoris: Secondary | ICD-10-CM | POA: Diagnosis not present

## 2014-08-30 DIAGNOSIS — I5022 Chronic systolic (congestive) heart failure: Secondary | ICD-10-CM | POA: Diagnosis not present

## 2014-08-30 DIAGNOSIS — E785 Hyperlipidemia, unspecified: Secondary | ICD-10-CM

## 2014-08-30 DIAGNOSIS — I255 Ischemic cardiomyopathy: Secondary | ICD-10-CM | POA: Diagnosis not present

## 2014-08-30 DIAGNOSIS — N183 Chronic kidney disease, stage 3 unspecified: Secondary | ICD-10-CM

## 2014-08-30 DIAGNOSIS — R011 Cardiac murmur, unspecified: Secondary | ICD-10-CM

## 2014-08-30 DIAGNOSIS — I10 Essential (primary) hypertension: Secondary | ICD-10-CM

## 2014-08-30 LAB — BASIC METABOLIC PANEL
BUN: 45 mg/dL — AB (ref 6–23)
CALCIUM: 9.3 mg/dL (ref 8.4–10.5)
CHLORIDE: 107 meq/L (ref 96–112)
CO2: 30 mEq/L (ref 19–32)
CREATININE: 2.48 mg/dL — AB (ref 0.40–1.50)
GFR: 27.71 mL/min — ABNORMAL LOW (ref >60.00)
Glucose, Bld: 92 mg/dL (ref 70–99)
Potassium: 4.3 mEq/L (ref 3.5–5.1)
Sodium: 142 mEq/L (ref 135–145)

## 2014-08-30 LAB — MAGNESIUM: MAGNESIUM: 2.5 mg/dL (ref 1.5–2.5)

## 2014-08-30 NOTE — Telephone Encounter (Signed)
pt notified of lab results and med changes. Decrease lasix to 80 AM/40 mg PM, decrease K+ to 20 meq AM/10 meq PM, bmet 1 week. Pt would like to see if ok to have lab on 4/15 same day as echo. I stated I will d/w Lorin PicketScott W. and cb later, pt said ok.

## 2014-08-30 NOTE — Patient Instructions (Signed)
LAB WORK TODAY; BMET, MAGNESIUM LEVEL  FOLLOW UP WITH SCOTT WEAVER, PAC 3-4 WEEKS SAME DAY DR. Excell SeltzerOOPER IS IN THE OFFICE  Your physician has requested that you have an echocardiogram. Echocardiography is a painless test that uses sound waves to create images of your heart. It provides your doctor with information about the size and shape of your heart and how well your heart's chambers and valves are working. This procedure takes approximately one hour. There are no restrictions for this procedure.

## 2014-08-30 NOTE — Telephone Encounter (Signed)
pt notified ok per Bing NeighborsScott W. PA to get lab 4/15 same day as echo. Pt said thank you.

## 2014-09-03 ENCOUNTER — Ambulatory Visit (HOSPITAL_COMMUNITY): Payer: Medicare Other | Attending: Physician Assistant | Admitting: Cardiology

## 2014-09-03 ENCOUNTER — Other Ambulatory Visit (INDEPENDENT_AMBULATORY_CARE_PROVIDER_SITE_OTHER): Payer: Medicare Other | Admitting: *Deleted

## 2014-09-03 ENCOUNTER — Telehealth: Payer: Self-pay | Admitting: *Deleted

## 2014-09-03 DIAGNOSIS — R011 Cardiac murmur, unspecified: Secondary | ICD-10-CM | POA: Diagnosis present

## 2014-09-03 DIAGNOSIS — N183 Chronic kidney disease, stage 3 unspecified: Secondary | ICD-10-CM

## 2014-09-03 DIAGNOSIS — I5022 Chronic systolic (congestive) heart failure: Secondary | ICD-10-CM | POA: Diagnosis not present

## 2014-09-03 LAB — BASIC METABOLIC PANEL
BUN: 45 mg/dL — ABNORMAL HIGH (ref 6–23)
CHLORIDE: 105 meq/L (ref 96–112)
CO2: 31 meq/L (ref 19–32)
Calcium: 9 mg/dL (ref 8.4–10.5)
Creatinine, Ser: 2.46 mg/dL — ABNORMAL HIGH (ref 0.40–1.50)
GFR: 27.97 mL/min — ABNORMAL LOW (ref >60.00)
Glucose, Bld: 112 mg/dL — ABNORMAL HIGH (ref 70–99)
POTASSIUM: 3.6 meq/L (ref 3.5–5.1)
SODIUM: 140 meq/L (ref 135–145)

## 2014-09-03 NOTE — Telephone Encounter (Signed)
pt notified about lab results with verbakl understanding. Pt asked about his echo results. I stated the echo report has not yet been read, once read I will call with the results, pt said ok and thank you.

## 2014-09-03 NOTE — Progress Notes (Signed)
Echo performed. 

## 2014-09-07 ENCOUNTER — Encounter: Payer: Self-pay | Admitting: Physician Assistant

## 2014-09-07 ENCOUNTER — Telehealth: Payer: Self-pay | Admitting: Physician Assistant

## 2014-09-07 NOTE — Telephone Encounter (Signed)
Please tell patient that recent echo is overall stable. EF is 25-30% which is c/w prior measurements. MV repair is ok with only mild MR. Pulmonary pressures are moderately elevated. Continue with current treatment plan. Tereso NewcomerScott Davelyn Gwinn, PA-C   09/07/2014 3:14 PM

## 2014-09-07 NOTE — Telephone Encounter (Signed)
pt notified of echo results and findings by phone with verbal understanding. Advised pt to keep 5/9 appt with Bing NeighborsScott W. PA. Pt said ok and thank you.

## 2014-09-26 NOTE — Progress Notes (Signed)
Cardiology Office Note   Date:  09/27/2014   ID:  Nicholas Stanley, DOB 20-Jan-1946, MRN [id number]  PCP:  Alois Cliche, MD  Cardiologist:  Dr. Tonny Bollman     Chief Complaint  Patient presents with  . Congestive Heart Failure     History of Present Illness: Nicholas Stanley is a 69 y.o. male with a hx of CAD, status post emergent CABG + MV repair 02/2007, ischemic cardiomyopathy, systolic CHF, HTN, CKD, HL, renal artery stenosis. Patient has declined CRT or ICD in the past. Recently seen by Dr. Excell Seltzer 08/18/14 after an 18 month hiatus.  He had recently been seen in the emergency room at [facility]. He was given IV Lasix.  Patient continued to demonstrate evidence of acute on chronic systolic CHF when he saw Dr. Excell Seltzer. Lasix was adjusted.  I saw him for FU 08/30/14.  FU Echo 09/07/14 demonstrated EF 25-30%, well functioning MV repair with mild MR, PASP 55 mmHg.    He returns for FU.   He has been doing well since last seen. He denies chest pain. He denies syncope or near syncope. He did [place] track this past weekend. At one point he got "weak in the knees". He denies dizziness with this. He denies any significant change in his dyspnea with exertion. Overall, he is NYHA 2-2b. He denies orthopnea or PND. LE edema is improved.   Studies/Reports Reviewed Today:  Echo 09/03/14 - EF 25% to 30%.  Diffuse hypokinesis. - Aortic valve: Small systolic gradient no stenosis. - Mitral valve: Post repair and annuloplasty ring mild residual MR. - Left atrium: The atrium was mildly dilated. - Right ventricle: The cavity size was mildly dilated. - Right atrium: The atrium was moderately dilated. - Atrial septum: No defect or patent foramen ovale was identified. - Tricuspid valve: There was moderate regurgitation. - Pulmonary arteries: PA peak pressure: 55 mm Hg (S). - Impressions: E/E&' elevated but not reflective of EDP with   annuloplasty ring.  Carotid US 08/2011 Bilateral ICA  0-39% >> FU 1 year  Echo 09/2007 EF 20%, inferoposterior AK, apical inferoseptal AK, restrictive physiology AV with restricted motion of left coronary cusp Fibrocalcific change of aortic root Mitral ring annuloplasty with mild-moderate MR, mean gradient 3 mmHg Moderate LAE Mild RVE, moderately reduced RVSF, peak RVSP moderately increased Moderately increased PASP Moderate TR Mild RAE  Cardiac cath 02/24/07 LM: Ruptured plaque in the ostium/proximal portion, distal 20-30% LAD: Stents in proximal and mid and D1 patent LCx: Mid stent patent RCA: Proximal ulcerated plaque 50%, distal occluded EF: 20-30%  Past Medical History  Diagnosis Date  . CAD (coronary artery disease) 2008    s/p CABG  . HYPERLIPIDEMIA-MIXED   . RENAL ARTERY STENOSIS   . Old MI (myocardial infarction) 2008  . Ischemic cardiomyopathy     a. has refused CRT-D in past;  b. echo 4/16: EF 25-30%, diffuse HK, no aortic stenosis, status post mitral valve repair with residual mild MR, mild LAE, moderate RAE, mild RVE, moderate TR, PASP 55    Past Surgical History  Procedure Laterality Date  . Coronary artery bypass graft  2008  . Mitral valve repair  2008     Current Outpatient Prescriptions  Medication Sig Dispense Refill  . allopurinol (ZYLOPRIM) 100 MG tablet Take 100 mg by mouth daily. TAKE TWO  DAILY    . aspirin 81 MG tablet Take 81 mg by mouth daily.      . carvedilol (  COREG) 12.5 MG tablet take 1 tablet by mouth twice a day 60 tablet 0  . digoxin (LANOXIN) 0.125 MG tablet Take 0.125 mg by mouth every other day.    . furosemide (LASIX) 80 MG tablet Take 1 tablet (80 mg total) by mouth 2 (two) times daily. (Patient taking differently: Take 80 mg by mouth 2 (two) times daily. Take 80 mg by mouth in am and 40 mg by mouth in the pm) 60 tablet 11  . potassium chloride SA (KLOR-CON M20) 20 MEQ tablet Take 1 tablet (20 mEq total) by mouth 2 (two) times daily. 60 tablet 11  . simvastatin (ZOCOR) 40 MG tablet take  1 tablet by mouth daily every evening 90 tablet 1  . valsartan (DIOVAN) 40 MG tablet Take 1 tablet (40 mg total) by mouth 2 (two) times daily. 180 tablet 0   No current facility-administered medications for this visit.    Allergies:   Review of patient's allergies indicates no known allergies.    Social History:  The patient  reports that he has quit smoking. He does not have any smokeless tobacco history on file. He reports that he does not drink alcohol or use illicit drugs.   Family History:  The patient's family history includes Cancer in his paternal grandfather; Coronary artery disease in his other; Diabetes in his brother; Heart attack in his father; Stroke in his mother.    ROS:   Please see the history of present illness.   Review of Systems  Constitution: Negative for chills and fever.  Respiratory: Negative for cough.   Gastrointestinal: Negative for hematochezia and melena.  All other systems reviewed and are negative.    PHYSICAL EXAM: VS:  BP 90/60 mmHg  Pulse 55  Ht 5\' 9"  (1.753 m)  Wt 172 lb (78.019 kg)  BMI 25.39 kg/m2    Wt Readings from Last 3 Encounters:  09/27/14 172 lb (78.019 kg)  08/30/14 170 lb (77.111 kg)  08/18/14 176 lb 6.4 oz (80.015 kg)     GEN: Well nourished, well developed, in no acute distress HEENT: normal Neck: no JVD, no masses Cardiac:  Normal S1/S2, RRR; 1/6 systolic murmur RUSB, no rubs or gallops, 1-2+ bilateral ankle edema  Respiratory:  Decreased breath sounds bilaterally, no rales GI: soft, nontender, nondistended, + BS MS: no deformity or atrophy Skin: warm and dry  Neuro:  CNs II-XII intact, Strength and sensation are intact Psych: Normal affect   EKG:  EKG is ordered today.  It demonstrates:   Sinus bradycardia, HR 55, LBBB    Recent Labs: 08/30/2014: Magnesium 2.5 09/03/2014: BUN 45*; Creatinine 2.46*; Potassium 3.6; Sodium 140    Lipid Panel    Component Value Date/Time   CHOL 105 09/26/2012 0925   TRIG 77.0  09/26/2012 0925   HDL 42.40 09/26/2012 0925   CHOLHDL 2 09/26/2012 0925   VLDL 15.4 09/26/2012 0925   LDLCALC 47 09/26/2012 0925   LDLDIRECT 91.2 03/22/2009 1119      ASSESSMENT AND PLAN:  Chronic systolic CHF (congestive heart failure) Volume stable/improved. He is NYHA 2-2b. Continue current therapy. Check basic metabolic panel today.    Ischemic cardiomyopathy Recent echocardiogram with EF 25-30%. He has declined ICD in the past. Continue current dose of Coreg, Diovan. With his renal function, I am concerned about digoxin. I will reduce his dose to 0.0625 mg every other day. Arrange digoxin level (he took his medicine this morning).  Coronary artery disease involving native coronary artery of native  heart without angina pectoris No angina. Continue ASA, statin, beta-blocker, angiotensin receptor blocker.  Hyperlipidemia Continue statin Rx.  Essential hypertension Blood pressure running low. Question if the episode this past weekend was related to low blood pressure. Although, he denies dizziness or near-syncope. I will reduce his valsartan to 40 mg daily.   Chronic kidney disease, stage III (moderate)  Arrange FU BMET today.   S/p Mitral Valve Repair Stable by recent echocardiogram. Continue SBE prophylaxis.  Current medicines are reviewed at length with the patient today.  Any concerns are as outlined above.  The following changes have been made:    Decrease digoxin to 0.0625 mg every other day.  Decrease valsartan to 40 mg daily   Labs/ tests ordered today include:  Orders Placed This Encounter  Procedures  . Basic Metabolic Panel (BMET)  . Digoxin level  . EKG 12-Lead    Disposition:   FU Dr. Excell Seltzerooper 3 months   Signed, Tereso NewcomerScott Jadore Mcguffin, PA-C, MHS 09/27/2014 8:36 AM    [facility] [address], [place], [place]  [address] Phone: [phone]; Fax: [phone]

## 2014-09-27 ENCOUNTER — Encounter: Payer: Self-pay | Admitting: Physician Assistant

## 2014-09-27 ENCOUNTER — Ambulatory Visit (INDEPENDENT_AMBULATORY_CARE_PROVIDER_SITE_OTHER): Payer: Medicare Other | Admitting: Physician Assistant

## 2014-09-27 VITALS — BP 90/60 | HR 55 | Ht 69.0 in | Wt 172.0 lb

## 2014-09-27 DIAGNOSIS — N183 Chronic kidney disease, stage 3 unspecified: Secondary | ICD-10-CM

## 2014-09-27 DIAGNOSIS — Z79899 Other long term (current) drug therapy: Secondary | ICD-10-CM

## 2014-09-27 DIAGNOSIS — I10 Essential (primary) hypertension: Secondary | ICD-10-CM

## 2014-09-27 DIAGNOSIS — Z5181 Encounter for therapeutic drug level monitoring: Secondary | ICD-10-CM

## 2014-09-27 DIAGNOSIS — I5022 Chronic systolic (congestive) heart failure: Secondary | ICD-10-CM

## 2014-09-27 DIAGNOSIS — Z9889 Other specified postprocedural states: Secondary | ICD-10-CM

## 2014-09-27 DIAGNOSIS — I255 Ischemic cardiomyopathy: Secondary | ICD-10-CM

## 2014-09-27 DIAGNOSIS — I2510 Atherosclerotic heart disease of native coronary artery without angina pectoris: Secondary | ICD-10-CM | POA: Diagnosis not present

## 2014-09-27 DIAGNOSIS — E785 Hyperlipidemia, unspecified: Secondary | ICD-10-CM

## 2014-09-27 LAB — BASIC METABOLIC PANEL
BUN: 40 mg/dL — ABNORMAL HIGH (ref 6–23)
CALCIUM: 9 mg/dL (ref 8.4–10.5)
CO2: 28 mEq/L (ref 19–32)
Chloride: 104 mEq/L (ref 96–112)
Creatinine, Ser: 2.52 mg/dL — ABNORMAL HIGH (ref 0.40–1.50)
GFR: 27.2 mL/min — AB (ref >60.00)
GLUCOSE: 90 mg/dL (ref 70–99)
Potassium: 3.7 mEq/L (ref 3.5–5.1)
Sodium: 138 mEq/L (ref 135–145)

## 2014-09-27 MED ORDER — VALSARTAN 40 MG PO TABS: 40.0000 mg | ORAL_TABLET | Freq: Every day | ORAL | Status: DC

## 2014-09-27 MED ORDER — DIGOXIN 125 MCG PO TABS: 0.0625 mg | ORAL_TABLET | ORAL | Status: DC

## 2014-09-27 NOTE — Patient Instructions (Addendum)
Medication Instructions:  1) DECREASE Digoxin to 0.0625mg  every other day 2) DECREASE Diovan to 40mg  daily  Labwork: Your physician recommends that you have lab work: today Designer, jewellery(BMET)  Your physician recommends that you return for lab work in: 2 weeks for a Digoxin Level-TAKE YOUR DIGOXIN AFTER YOUR LAB IS DRAWN   Testing/Procedures: None  Follow-Up: Your physician recommends that you schedule a follow-up appointment in: 3 months with Dr. Excell Seltzerooper.   Any Other Special Instructions Will Be Listed Below (If Applicable).

## 2014-09-28 ENCOUNTER — Telehealth: Payer: Self-pay | Admitting: *Deleted

## 2014-09-28 NOTE — Telephone Encounter (Signed)
Pt notified of lab results. Pt verified that he is to hold his digoxin the morning of  5/23 lab work for digoxin level. I said yes that is correct. Pt said thank you.

## 2014-10-11 ENCOUNTER — Other Ambulatory Visit: Payer: Medicare Other

## 2014-10-25 ENCOUNTER — Other Ambulatory Visit (INDEPENDENT_AMBULATORY_CARE_PROVIDER_SITE_OTHER): Payer: Medicare Other | Admitting: *Deleted

## 2014-10-25 DIAGNOSIS — Z5181 Encounter for therapeutic drug level monitoring: Secondary | ICD-10-CM | POA: Diagnosis not present

## 2014-10-25 DIAGNOSIS — Z79899 Other long term (current) drug therapy: Secondary | ICD-10-CM | POA: Diagnosis not present

## 2014-10-26 LAB — DIGOXIN LEVEL: Digoxin Level: 1.3 ng/mL (ref 0.8–2.0)

## 2014-10-28 ENCOUNTER — Telehealth: Payer: Self-pay | Admitting: Physician Assistant

## 2014-10-28 NOTE — Telephone Encounter (Signed)
Pt notified about lab results and to d/c digoxin. I went over these instructions x 3 with pt until he could verbalized understanding to plan of care.

## 2014-10-28 NOTE — Telephone Encounter (Signed)
New message ° ° ° ° °Returning a nurses call to get lab results °

## 2015-01-26 ENCOUNTER — Emergency Department (HOSPITAL_COMMUNITY)
Admission: EM | Admit: 2015-01-26 | Discharge: 2015-01-26 | Disposition: A | Discharge status: Home / Self Care | Payer: Medicare Other | Attending: Emergency Medicine | Admitting: Emergency Medicine

## 2015-01-26 ENCOUNTER — Encounter: Payer: Self-pay | Admitting: Cardiovascular Disease

## 2015-01-26 ENCOUNTER — Emergency Department (HOSPITAL_COMMUNITY): Payer: Medicare Other

## 2015-01-26 ENCOUNTER — Ambulatory Visit (INDEPENDENT_AMBULATORY_CARE_PROVIDER_SITE_OTHER): Payer: Medicare Other | Admitting: Cardiovascular Disease

## 2015-01-26 ENCOUNTER — Encounter (HOSPITAL_COMMUNITY): Payer: Self-pay | Admitting: Family Medicine

## 2015-01-26 VITALS — BP 92/50 | HR 62 | Ht 69.0 in | Wt 167.0 lb

## 2015-01-26 DIAGNOSIS — R112 Nausea with vomiting, unspecified: Secondary | ICD-10-CM | POA: Diagnosis not present

## 2015-01-26 DIAGNOSIS — R001 Bradycardia, unspecified: Secondary | ICD-10-CM | POA: Insufficient documentation

## 2015-01-26 DIAGNOSIS — Z79899 Other long term (current) drug therapy: Secondary | ICD-10-CM | POA: Diagnosis not present

## 2015-01-26 DIAGNOSIS — Z87891 Personal history of nicotine dependence: Secondary | ICD-10-CM | POA: Insufficient documentation

## 2015-01-26 DIAGNOSIS — I5022 Chronic systolic (congestive) heart failure: Secondary | ICD-10-CM | POA: Diagnosis not present

## 2015-01-26 DIAGNOSIS — R0602 Shortness of breath: Secondary | ICD-10-CM

## 2015-01-26 DIAGNOSIS — E785 Hyperlipidemia, unspecified: Secondary | ICD-10-CM

## 2015-01-26 DIAGNOSIS — I252 Old myocardial infarction: Secondary | ICD-10-CM | POA: Diagnosis not present

## 2015-01-26 DIAGNOSIS — R531 Weakness: Secondary | ICD-10-CM | POA: Diagnosis present

## 2015-01-26 DIAGNOSIS — I255 Ischemic cardiomyopathy: Secondary | ICD-10-CM | POA: Diagnosis not present

## 2015-01-26 DIAGNOSIS — Z7982 Long term (current) use of aspirin: Secondary | ICD-10-CM | POA: Diagnosis not present

## 2015-01-26 DIAGNOSIS — I5023 Acute on chronic systolic (congestive) heart failure: Secondary | ICD-10-CM | POA: Diagnosis not present

## 2015-01-26 DIAGNOSIS — E782 Mixed hyperlipidemia: Secondary | ICD-10-CM | POA: Insufficient documentation

## 2015-01-26 DIAGNOSIS — I10 Essential (primary) hypertension: Secondary | ICD-10-CM

## 2015-01-26 DIAGNOSIS — I2510 Atherosclerotic heart disease of native coronary artery without angina pectoris: Secondary | ICD-10-CM | POA: Insufficient documentation

## 2015-01-26 LAB — I-STAT TROPONIN, ED: Troponin i, poc: 0.03 ng/mL (ref 0.00–0.08)

## 2015-01-26 LAB — BASIC METABOLIC PANEL
ANION GAP: 8 (ref 5–15)
BUN: 34 mg/dL — ABNORMAL HIGH (ref 6–20)
CHLORIDE: 108 mmol/L (ref 101–111)
CO2: 23 mmol/L (ref 22–32)
Calcium: 8.8 mg/dL — ABNORMAL LOW (ref 8.9–10.3)
Creatinine, Ser: 2.39 mg/dL — ABNORMAL HIGH (ref 0.61–1.24)
GFR calc non Af Amer: 26 mL/min — ABNORMAL LOW (ref >60)
GFR, EST AFRICAN AMERICAN: 30 mL/min — AB (ref >60)
Glucose, Bld: 91 mg/dL (ref 65–99)
POTASSIUM: 3.9 mmol/L (ref 3.5–5.1)
SODIUM: 139 mmol/L (ref 135–145)

## 2015-01-26 LAB — BRAIN NATRIURETIC PEPTIDE: B Natriuretic Peptide: 884.1 pg/mL — ABNORMAL HIGH (ref 0.0–100.0)

## 2015-01-26 LAB — CBC
HEMATOCRIT: 36.9 % — AB (ref 39.0–52.0)
Hemoglobin: 11.4 g/dL — ABNORMAL LOW (ref 13.0–17.0)
MCH: 29.7 pg (ref 26.0–34.0)
MCHC: 30.9 g/dL (ref 30.0–36.0)
MCV: 96.1 fL (ref 78.0–100.0)
Platelets: 118 10*3/uL — ABNORMAL LOW (ref 150–400)
RBC: 3.84 MIL/uL — AB (ref 4.22–5.81)
RDW: 17.4 % — ABNORMAL HIGH (ref 11.5–15.5)
WBC: 5.8 10*3/uL (ref 4.0–10.5)

## 2015-01-26 MED ORDER — FUROSEMIDE 10 MG/ML IJ SOLN
80.0000 mg | Freq: Once | INTRAMUSCULAR | Status: AC
  Administered 2015-01-26: 80 mg via INTRAVENOUS
  Filled 2015-01-26: qty 8

## 2015-01-26 MED ORDER — CARVEDILOL 6.25 MG PO TABS: 6.2500 mg | ORAL_TABLET | Freq: Two times a day (BID) | ORAL | Status: DC

## 2015-01-26 NOTE — Discharge Instructions (Signed)
Increase the Lasix back up to 80 mg twice a day. Decrease the Coreg to 6.25 mg twice a day. Stop the Diovan

## 2015-01-26 NOTE — ED Notes (Signed)
Pt sent here from cardiologist. sts weakness and SOB. Pt was hypotensive there and EKG changes,

## 2015-01-26 NOTE — Progress Notes (Signed)
Pt sent to DR from office today. Please see my full note for details. I have reviewed all data from the ER evaluation and discussed his case with Dr Rubin Payor. CXR is clear, renal function stable, and patient is clinically improved. His BNP is elevated, and the patient tells me that his PCP reduced his lasix by 50% a few weeks ago. Considering elevated BNP, recommend a single dose of IV lasix here in the ER then plan on discharge if clinically stable. He does not want to stay in the hospital. I will reduce his carvedilol dose to 6.25 mg BID, hold valsartan, and increase lasix back to 80 mg BID. He will have close follow-up in the cardiology clinic in the next 1-2 weeks.   Tonny Bollman 01/26/2015 7:01 PM

## 2015-01-26 NOTE — ED Provider Notes (Signed)
CSN: [id number]     Arrival date & time 01/26/15  1545 History   First MD Initiated Contact with Patient 01/26/15 1721     Chief Complaint  Patient presents with  . Weakness  . Shortness of Breath     (Consider location/radiation/quality/duration/timing/severity/associated sxs/prior Treatment) Patient is a 69 y.o. male presenting with weakness and shortness of breath. The history is provided by the patient.  Weakness This is a new problem. Associated symptoms include shortness of breath. Pertinent negatives include no abdominal pain.  Shortness of Breath Associated symptoms: vomiting   Associated symptoms: no abdominal pain    patient has been weak over the last few days. Generalized weakness. No chest pain. Has had a cough with minimal production. No fevers. States he did have some vomiting last night and this morning. States he is not vomiting now. Seen cardiology and found be somewhat hypotensive. Does not appear volume overloaded although he does have a history of severe CHF. Pressures of 90 systolic there. Sent to the ER for further evaluation. No swelling in his legs. No dysuria.  Past Medical History  Diagnosis Date  . CAD (coronary artery disease) 2008    s/p CABG  . HYPERLIPIDEMIA-MIXED   . RENAL ARTERY STENOSIS   . Old MI (myocardial infarction) 2008  . Ischemic cardiomyopathy     a. has refused CRT-D in past;  b. echo 4/16: EF 25-30%, diffuse HK, no aortic stenosis, status post mitral valve repair with residual mild MR, mild LAE, moderate RAE, mild RVE, moderate TR, PASP 55   Past Surgical History  Procedure Laterality Date  . Coronary artery bypass graft  2008  . Mitral valve repair  2008   Family History  Problem Relation Age of Onset  . Coronary artery disease Other   . Stroke Mother   . Heart attack Father   . Diabetes Brother   . Cancer Paternal Grandfather    Social History  Substance Use Topics  . Smoking status: Former Games developer  . Smokeless tobacco: None      Comment: PATIENT HAD NOT SMOKE FOR TWO MONTHS  . Alcohol Use: No    Review of Systems  Constitutional: Positive for fatigue.  Respiratory: Positive for shortness of breath.   Gastrointestinal: Positive for nausea and vomiting. Negative for abdominal pain.  Genitourinary: Negative for dysuria.  Musculoskeletal: Negative for back pain.  Skin: Negative for wound.  Neurological: Positive for weakness.      Allergies  Review of patient's allergies indicates no known allergies.  Home Medications   Prior to Admission medications   Medication Sig Start Date End Date Taking? Authorizing Provider  allopurinol (ZYLOPRIM) 100 MG tablet Take 100 mg by mouth daily. TAKE TWO  DAILY 10/22/12   Historical Provider, MD  aspirin 81 MG tablet Take 81 mg by mouth daily.      Historical Provider, MD  carvedilol (COREG) 12.5 MG tablet take 1 tablet by mouth twice a day 02/24/14   Tonny Bollman, MD  carvedilol (COREG) 6.25 MG tablet Take 1 tablet (6.25 mg total) by mouth 2 (two) times daily with a meal. 01/26/15   Tonny Bollman, MD  furosemide (LASIX) 80 MG tablet Take 1 tablet (80 mg total) by mouth 2 (two) times daily. Patient taking differently: Take 80 mg by mouth 2 (two) times daily. Take 80 mg by mouth in am and 40 mg by mouth in the pm 08/18/14   Tonny Bollman, MD  potassium chloride SA (KLOR-CON M20) 20 MEQ tablet  Take 1 tablet (20 mEq total) by mouth 2 (two) times daily. 08/18/14   Tonny Bollman, MD  simvastatin (ZOCOR) 40 MG tablet take 1 tablet by mouth daily every evening 09/18/12   Tonny Bollman, MD  valsartan (DIOVAN) 40 MG tablet Take 1 tablet (40 mg total) by mouth daily. 09/27/14   Beatrice Lecher, PA-C   BP 110/76 mmHg  Pulse 64  Temp(Src) 97.6 F (36.4 C) (Oral)  Resp 17  SpO2 100% Physical Exam  Constitutional: He appears well-developed.  Neck: Neck supple.  Cardiovascular: Regular rhythm.   Mild bradycardia  Pulmonary/Chest:  Few scattered wheezes.  Abdominal: Soft. There is  no tenderness.  Musculoskeletal: Normal range of motion. He exhibits no edema.  Neurological: He is alert.  Skin: Skin is warm.    ED Course  Procedures (including critical care time) Labs Review Labs Reviewed  BASIC METABOLIC PANEL - Abnormal; Notable for the following:    BUN 34 (*)    Creatinine, Ser 2.39 (*)    Calcium 8.8 (*)    GFR calc non Af Amer 26 (*)    GFR calc Af Amer 30 (*)    All other components within normal limits  CBC - Abnormal; Notable for the following:    RBC 3.84 (*)    Hemoglobin 11.4 (*)    HCT 36.9 (*)    RDW 17.4 (*)    Platelets 118 (*)    All other components within normal limits  BRAIN NATRIURETIC PEPTIDE - Abnormal; Notable for the following:    B Natriuretic Peptide 884.1 (*)    All other components within normal limits  I-STAT TROPOININ, ED    Imaging Review Dg Chest 2 View  01/26/2015   CLINICAL DATA:  Shortness of breath, wheezing and cough for 1 month.  EXAM: CHEST  2 VIEW  COMPARISON:  PA and lateral chest 04/04/2007.  FINDINGS: The patient is status post CABG and mitral valve repair. There is cardiomegaly without edema. The lungs are clear. No pneumothorax or pleural effusion. No focal bony abnormality.  IMPRESSION: Cardiomegaly without acute disease.   Electronically Signed   By: Drusilla Kanner M.D.   On: 01/26/2015 18:06   I have personally reviewed and evaluated these images and lab results as part of my medical decision-making.   EKG Interpretation   Date/Time:  Wednesday January 26 2015 16:30:23 EDT Ventricular Rate:  58 PR Interval:  232 QRS Duration: 188 QT Interval:  492 QTC Calculation: 482 R Axis:   24 Text Interpretation:  Sinus bradycardia with 1st degree A-V block Left  bundle branch block Abnormal ECG Confirmed by Keva Darty  MD, Harrold Donath  [phone]) on 01/26/2015 5:22:40 PM      MDM   Final diagnoses:  Chronic systolic heart failure    Patient with generalized weakness and some shortness of breath. Sent in by  cardiology. Patient reportedly looks much better here than he did in the office. His been seen by    Dr. Excell Seltzer in the ER also. PCP has recently changed the patient's medication around. Will give IV Lasix here patient feels better. Will be discharged home to follow-up. Medication adjustments have been done per cardiology's recommendations.  Benjiman Core, MD 01/26/15 [phone]

## 2015-01-26 NOTE — Progress Notes (Signed)
Cardiology Office Note   Date:  01/26/2015   ID:  Nicholas Stanley, DOB 1945-10-09, MRN [id number]  PCP:  Alois Cliche, MD  Cardiologist:  Tonny Bollman, MD    Chief Complaint  Patient presents with  . Follow-up     History of Present Illness: Nicholas Stanley is a 69 y.o. male who presents for follow-up of chronic systolic heart failure. The patient has extensive CAD with history of multiple myocardial infarctions. He was treated with emergency coronary bypass surgery but had previously undergone PCI procedures. His left ventricular ejection fraction has been in the range of 30% and he has declined cardiac resynchronization and or ICD implantation.  I have followed him for several years and he has done reasonably well with standard medical therapy. The patient has developed chronic kidney disease over time.  He complains of extreme weakness over the past 3-4 days. Also complains of worsening shortness of breath with minimal activity. He denies orthopnea, PND, leg swelling, or abdominal swelling. He reports poor appetite. Denies bleeding problems. No change in bowel habits.  Denies fevers/chills/productive cough. He does have a marked nonproductive cough  And he experiences severe lightheadedness during coughing attacks.  He complains of nausea.   He has had a lot of trouble sleeping over the past few days.  The patient is here with his son today.  Past Medical History  Diagnosis Date  . CAD (coronary artery disease) 2008    s/p CABG  . HYPERLIPIDEMIA-MIXED   . RENAL ARTERY STENOSIS   . Old MI (myocardial infarction) 2008  . Ischemic cardiomyopathy     a. has refused CRT-D in past;  b. echo 4/16: EF 25-30%, diffuse HK, no aortic stenosis, status post mitral valve repair with residual mild MR, mild LAE, moderate RAE, mild RVE, moderate TR, PASP 55    Past Surgical History  Procedure Laterality Date  . Coronary artery bypass graft  2008  . Mitral valve repair  2008    Current  Outpatient Prescriptions  Medication Sig Dispense Refill  . allopurinol (ZYLOPRIM) 100 MG tablet Take 100 mg by mouth daily. TAKE TWO  DAILY    . aspirin 81 MG tablet Take 81 mg by mouth daily.      . carvedilol (COREG) 12.5 MG tablet take 1 tablet by mouth twice a day 60 tablet 0  . furosemide (LASIX) 80 MG tablet Take 1 tablet (80 mg total) by mouth 2 (two) times daily. (Patient taking differently: Take 80 mg by mouth 2 (two) times daily. Take 80 mg by mouth in am and 40 mg by mouth in the pm) 60 tablet 11  . potassium chloride SA (KLOR-CON M20) 20 MEQ tablet Take 1 tablet (20 mEq total) by mouth 2 (two) times daily. 60 tablet 11  . simvastatin (ZOCOR) 40 MG tablet take 1 tablet by mouth daily every evening 90 tablet 1  . valsartan (DIOVAN) 40 MG tablet Take 1 tablet (40 mg total) by mouth daily. 180 tablet 0   No current facility-administered medications for this visit.    Allergies:   Review of patient's allergies indicates no known allergies.   Social History:  The patient  reports that he has quit smoking. He does not have any smokeless tobacco history on file. He reports that he does not drink alcohol or use illicit drugs.   Family History:  The patient's family history includes Cancer in his paternal grandfather; Coronary artery disease in his other; Diabetes in his brother; Heart attack  in his father; Stroke in his mother.   ROS:  Please see the history of present illness.  Otherwise, review of systems is positive for weakness, fatigue, lightheadedness, nonproductive cough.  No fever, chills. All other systems are reviewed and negative.   PHYSICAL EXAM: VS:  BP 92/50 mmHg  Pulse 62  Ht 5\' 9"  (1.753 m)  Wt 167 lb (75.751 kg)  BMI 24.65 kg/m2 , BMI Body mass index is 24.65 kg/(m^2). GEN:  Chronically ill-appearing male,  Somnolent at times during my evaluation,  Otherwise oriented and appropriate in no acute distress HEENT: normal Neck:  Jugular venous pressure is moderately  elevated, no masses. No carotid bruits Cardiac:  Regular rate and rhythm with grade 2/6 systolic murmur at the left lower sternal border               Respiratory:  clear to auscultation bilaterally,  Mildly increased work of breathing GI: soft, nontender MS: no deformity or atrophy Ext: no pretibial edema Skin: warm and dry, no rash Neuro:  Strength and sensation are intact  EKG:  EKG is ordered today. The ekg ordered today shows  Normal sinus rhythm with left bundle branch block heart rate 61 bpm , first-degree AV block  Recent Labs: 08/30/2014: Magnesium 2.5 09/27/2014: BUN 40*; Creatinine, Ser 2.52*; Potassium 3.7; Sodium 138   Lipid Panel     Component Value Date/Time   CHOL 105 09/26/2012 0925   TRIG 77.0 09/26/2012 0925   HDL 42.40 09/26/2012 0925   CHOLHDL 2 09/26/2012 0925   VLDL 15.4 09/26/2012 0925   LDLCALC 47 09/26/2012 0925   LDLDIRECT 91.2 03/22/2009 1119      Wt Readings from Last 3 Encounters:  01/26/15 167 lb (75.751 kg)  09/27/14 172 lb (78.019 kg)  08/30/14 170 lb (77.111 kg)     Cardiac Studies Reviewed: 2D Echo 09/03/2014: Study Conclusions  - Left ventricle: The cavity size was moderately dilated. Wall thickness was normal. Systolic function was severely reduced. The estimated ejection fraction was in the range of 25% to 30%. Diffuse hypokinesis. - Aortic valve: Small systolic gradient no stenosis. - Mitral valve: Post repair and annuloplasty ring mild residual MR. - Left atrium: The atrium was mildly dilated. - Right ventricle: The cavity size was mildly dilated. - Right atrium: The atrium was moderately dilated. - Atrial septum: No defect or patent foramen ovale was identified. - Tricuspid valve: There was moderate regurgitation. - Pulmonary arteries: PA peak pressure: 55 mm Hg (S). - Impressions: E/E&' elevated but not reflective of EDP with annuloplasty ring.  Impressions:  - E/E&' elevated but not reflective of EDP with  annuloplasty ring.  ASSESSMENT AND PLAN: 1.   Chronic systolic heart failure, New York Heart Association functional class IIIB/IV:  The patient has marked fatigue , weakness , and shortness of breath. He does not show clear evidence of volume overload on exam as he has had in the past with heart failure exacerbations. Differential diagnosis of his acute illness includes acute systolic heart failure , pulmonary embolus , community-acquired pneumonia , amongst other serious medical problems.  I think he clearly will require hospitalization. I have advised that he go directly to the emergency department at [facility]. His son is going to drive him across the street. I have called the triage nurse in the emergency room and have also notified our inpatient cardiology team in case he requires cardiology admission based on diagnostic testing in the emergency department.  2. Coronary artery disease,  native vessel:  No symptoms of angina. No change in baseline EKG with chronic left bundle branch block.  3. Hypertension with chronic kidney disease:  Hypotension noted. The patient is lightheaded and weak today. He will need assessment of his renal function and electrolytes.   4. Hyperlipidemia:  Remains on  simvastatin  Current medicines are reviewed with the patient today.  The patient does not have concerns regarding medicines.  Labs/ tests ordered today include:  No orders of the defined types were placed in this encounter.    Disposition:   Go directly to [facility] Emergency Department for evaluation.   Enzo Bi, MD  01/26/2015 3:05 PM    [facility] [address] [place], [place], [place]  [address] Phone: [phone]; Fax: [phone]

## 2015-01-26 NOTE — ED Notes (Signed)
Dr. Cooper at bedside 

## 2015-01-26 NOTE — Patient Instructions (Signed)
Please go directly over to the Emergency Department at [facility].

## 2015-01-27 ENCOUNTER — Other Ambulatory Visit: Payer: Self-pay | Admitting: Physician Assistant

## 2015-01-27 ENCOUNTER — Telehealth: Payer: Self-pay | Admitting: *Deleted

## 2015-01-27 DIAGNOSIS — I440 Atrioventricular block, first degree: Secondary | ICD-10-CM

## 2015-01-27 DIAGNOSIS — I509 Heart failure, unspecified: Secondary | ICD-10-CM

## 2015-01-27 DIAGNOSIS — N183 Chronic kidney disease, stage 3 unspecified: Secondary | ICD-10-CM

## 2015-01-27 DIAGNOSIS — N184 Chronic kidney disease, stage 4 (severe): Secondary | ICD-10-CM

## 2015-01-27 DIAGNOSIS — I10 Essential (primary) hypertension: Secondary | ICD-10-CM

## 2015-01-27 DIAGNOSIS — I5022 Chronic systolic (congestive) heart failure: Secondary | ICD-10-CM

## 2015-01-27 NOTE — Telephone Encounter (Signed)
Notified patient that Dr. Excell Seltzer is requesting lab work (BMET) prior to OV on 9/14 with Theodore Demark, PA. Patient notified and requested his arrival to be at 1:00 pm so that he can get the lab work prior to the appointment. Patient verbalized agreement and will arrive at 1:00 pm for lab work and then his appointment with Theodore Demark, PA, is at 1:30 pm.

## 2015-02-02 ENCOUNTER — Ambulatory Visit (INDEPENDENT_AMBULATORY_CARE_PROVIDER_SITE_OTHER): Payer: Medicare Other | Admitting: Physician Assistant

## 2015-02-02 ENCOUNTER — Other Ambulatory Visit (INDEPENDENT_AMBULATORY_CARE_PROVIDER_SITE_OTHER): Payer: Medicare Other | Admitting: *Deleted

## 2015-02-02 ENCOUNTER — Encounter: Payer: Self-pay | Admitting: Physician Assistant

## 2015-02-02 VITALS — BP 90/60 | HR 61 | Ht 69.0 in | Wt 165.0 lb

## 2015-02-02 DIAGNOSIS — I5022 Chronic systolic (congestive) heart failure: Secondary | ICD-10-CM

## 2015-02-02 DIAGNOSIS — N183 Chronic kidney disease, stage 3 unspecified: Secondary | ICD-10-CM

## 2015-02-02 DIAGNOSIS — E785 Hyperlipidemia, unspecified: Secondary | ICD-10-CM

## 2015-02-02 DIAGNOSIS — I509 Heart failure, unspecified: Secondary | ICD-10-CM | POA: Diagnosis not present

## 2015-02-02 DIAGNOSIS — I2510 Atherosclerotic heart disease of native coronary artery without angina pectoris: Secondary | ICD-10-CM | POA: Diagnosis not present

## 2015-02-02 DIAGNOSIS — I10 Essential (primary) hypertension: Secondary | ICD-10-CM | POA: Diagnosis not present

## 2015-02-02 DIAGNOSIS — I255 Ischemic cardiomyopathy: Secondary | ICD-10-CM

## 2015-02-02 DIAGNOSIS — I440 Atrioventricular block, first degree: Secondary | ICD-10-CM

## 2015-02-02 DIAGNOSIS — N184 Chronic kidney disease, stage 4 (severe): Secondary | ICD-10-CM | POA: Diagnosis not present

## 2015-02-02 LAB — BASIC METABOLIC PANEL
BUN: 37 mg/dL — ABNORMAL HIGH (ref 6–23)
CALCIUM: 8.6 mg/dL (ref 8.4–10.5)
CHLORIDE: 108 meq/L (ref 96–112)
CO2: 23 meq/L (ref 19–32)
Creatinine, Ser: 2.08 mg/dL — ABNORMAL HIGH (ref 0.40–1.50)
GFR: 33.9 mL/min — ABNORMAL LOW (ref >60.00)
GLUCOSE: 108 mg/dL — AB (ref 70–99)
POTASSIUM: 4 meq/L (ref 3.5–5.1)
SODIUM: 142 meq/L (ref 135–145)

## 2015-02-02 MED ORDER — FUROSEMIDE 80 MG PO TABS: 80.0000 mg | ORAL_TABLET | Freq: Two times a day (BID) | ORAL | Status: DC

## 2015-02-02 NOTE — Progress Notes (Addendum)
Cardiology Office Note   Date:  02/02/2015   ID:  Nicholas Stanley, DOB 29-May-1945, MRN [id number]  PCP:  Alois Cliche, MD  Cardiologist:  Dr Vale Haven, PA-C   Chief Complaint  Patient presents with  . Congestive Heart Failure    History of Present Illness: Nicholas Stanley is a 69 y.o. male with a history of PCI>>MI>>emergency CABG 2008, S-CHF, w/ EF 30% (declined ICD), MV repair  Nicholas Stanley presents for outpatient follow-up. He was recently seen in the office by Dr. Excell Seltzer. He had some volume overload by exam and admission was recommended. He was seen in the emergency room and given one dose of IV Lasix. His meds were adjusted and he was scheduled for follow-up. He is here today.  Nicholas Stanley feels much better since his medications have been adjusted. He is breathing better and has more energy. He has some chronic dyspnea on exertion but feels that he is at baseline. He has had no chest pain. He is compliant with his medications. He is not having any leg cramps or problems moving around.  Past Medical History  Diagnosis Date  . CAD (coronary artery disease) 2008    s/p CABG  . HYPERLIPIDEMIA-MIXED   . RENAL ARTERY STENOSIS   . Old MI (myocardial infarction) 2008  . Ischemic cardiomyopathy     a. has refused CRT-D in past;  b. echo 4/16: EF 25-30%, diffuse HK, no aortic stenosis, s/p MV repair with residual mild MR, mild LAE, moderate RAE, mild RVE, moderate TR, PASP 55   Past Surgical History  Procedure Laterality Date  . Coronary artery bypass graft  2008    LIMA-LAD, SVG-D1, SVG-OM1, SVG-PDA, MV repair w/ 30 mm Physio annuloplasty ring  . Mitral valve annuloplasty  2008    30 mm Physio ring    Current Outpatient Prescriptions  Medication Sig Dispense Refill  . allopurinol (ZYLOPRIM) 100 MG tablet Take 100 mg by mouth daily. TAKE TWO  DAILY    . aspirin 81 MG tablet Take 81 mg by mouth daily.      . carvedilol (COREG) 6.25 MG tablet Take 1 tablet (6.25 mg  total) by mouth 2 (two) times daily with a meal. 60 tablet 5  . furosemide (LASIX) 80 MG tablet Take 1 tablet (80 mg total) by mouth 2 (two) times daily. 60 tablet 11  . potassium chloride SA (KLOR-CON M20) 20 MEQ tablet Take 1 tablet (20 mEq total) by mouth 2 (two) times daily. 60 tablet 11  . simvastatin (ZOCOR) 40 MG tablet take 1 tablet by mouth daily every evening 90 tablet 1   No current facility-administered medications for this visit.    Allergies:   Review of patient's allergies indicates no known allergies.    Social History:  The patient  reports that he has quit smoking. He does not have any smokeless tobacco history on file. He reports that he does not drink alcohol or use illicit drugs.   Family History:  The patient's family history includes Cancer in his paternal grandfather; Coronary artery disease in his other; Diabetes in his brother; Heart attack in his father; Hypertension in his father; Stroke in his mother.    ROS:  Please see the history of present illness. All other systems are reviewed and negative.    PHYSICAL EXAM: VS:  BP 90/60 mmHg  Pulse 61  Ht  (1.753 m)  Wt 165 lb (74.844 kg)  BMI 24.36 kg/m2  SpO2  98% , BMI Body mass index is 24.36 kg/(m^2). GEN: Well nourished, well developed, in no acute distress HEENT: normal Neck: JVD 9 cm, no carotid bruits, or masses Cardiac: RRR; no murmurs, rubs, or gallops,no edema; distal pulses 2+ in all 4 extremities  Respiratory:  Decreased breath sounds bases bilaterally, scattered rales, normal work of breathing GI: soft, nontender, nondistended, + BS MS: no deformity or atrophy Skin: warm and dry, no rash Neuro:  Strength and sensation are intact Psych: euthymic mood, full affect   EKG:  EKG is not ordered today.  Recent Labs: 08/30/2014: Magnesium 2.5 01/26/2015: B Natriuretic Peptide 884.1*; BUN 34*; Creatinine, Ser 2.39*; Hemoglobin 11.4*; Platelets 118*; Potassium 3.9; Sodium 139    Lipid Panel      Component Value Date/Time   CHOL 105 09/26/2012 0925   TRIG 77.0 09/26/2012 0925   HDL 42.40 09/26/2012 0925   CHOLHDL 2 09/26/2012 0925   VLDL 15.4 09/26/2012 0925   LDLCALC 47 09/26/2012 0925   LDLDIRECT 91.2 03/22/2009 1119     Wt Readings from Last 3 Encounters:  02/02/15 165 lb (74.844 kg)  01/26/15 167 lb (75.751 kg)  09/27/14 172 lb (78.019 kg)     Other studies Reviewed: Additional studies/ records that were reviewed today include: Office visits, ER notes and previous records.  ASSESSMENT AND PLAN:  1.  Chronic systolic CHF: Nicholas Stanley weight is down 2 pounds from his last visit. He feels much better. His systolic blood pressures in the 90s, but he does well with this. However, because of his hypotension, we will not try to add back his ARB. No dose change in his beta blocker although it can be decreased as needed. He needs to pay attention to his symptoms and call us if he gets increasing shortness of breath, or edema.  He is encouraged to weigh himself daily but says scales packed up and that is not available. He is encouraged to check his blood pressure occasionally as well.   Current medicines are reviewed at length with the patient today.  The patient does not have concerns regarding medicines.  The following changes have been made:  no change  Labs/ tests ordered today include:  No orders of the defined types were placed in this encounter.     Disposition:   FU with Dr. Excell Seltzer, first available.  Tawny Asal  02/02/2015 2:21 PM    [facility] [address] [place], [place], [place]  [address] Phone: [phone]; Fax: [phone]

## 2015-02-02 NOTE — Patient Instructions (Signed)
Medication Instructions:  Your physician recommends that you continue on your current medications as directed. Please refer to the Current Medication list given to you today.   Labwork: None ordered  Testing/Procedures: None ordered  Follow-Up: Your physician recommends that you schedule a follow-up appointment 1ST AVAILABLE WITH DR. Excell Seltzer.   Any Other Special Instructions Will Be Listed Below (If Applicable).

## 2015-05-10 ENCOUNTER — Ambulatory Visit: Payer: Medicare Other | Admitting: Physician Assistant

## 2015-05-10 DIAGNOSIS — R0989 Other specified symptoms and signs involving the circulatory and respiratory systems: Secondary | ICD-10-CM

## 2015-05-18 ENCOUNTER — Encounter: Payer: Self-pay | Admitting: *Deleted

## 2015-05-27 ENCOUNTER — Ambulatory Visit: Payer: Medicare Other | Admitting: Cardiovascular Disease

## 2015-06-01 ENCOUNTER — Encounter: Payer: Self-pay | Admitting: Cardiovascular Disease

## 2015-06-01 ENCOUNTER — Ambulatory Visit (INDEPENDENT_AMBULATORY_CARE_PROVIDER_SITE_OTHER): Payer: Medicare Other | Admitting: Cardiovascular Disease

## 2015-06-01 VITALS — BP 90/60 | HR 70 | Ht 69.0 in | Wt 165.8 lb

## 2015-06-01 DIAGNOSIS — I5022 Chronic systolic (congestive) heart failure: Secondary | ICD-10-CM

## 2015-06-01 MED ORDER — METOLAZONE 2.5 MG PO TABS: ORAL_TABLET | ORAL | Status: AC

## 2015-06-01 NOTE — Progress Notes (Signed)
Cardiology Office Note Date:  06/02/2015   ID:  Nicholas Stanley, DOB [birth year], MRN [id number]  PCP:  Alois Cliche, MD  Cardiologist:  Tonny Bollman, MD    Chief Complaint  Patient presents with  . Follow-up    no energy    History of Present Illness: Nicholas Stanley is a 70 y.o. male who presents for follow-up evaluation. He has advanced cardiac disease with hx of multiple MI's, emergency CABG in 2008, mitral valve repair, and severe chronic heart failure with LVEF 30%.   He was last hospitalized in December 2016 with acute congestive heart failure and volume overload. I reviewed his records through care everywhere. I think he left the hospital AGAINST MEDICAL ADVICE. When he was seen in follow-up by his primary care physician, he remained volume overloaded. He was given 5 days of metolazone and his edema resolved after this treatment. He has continued on furosemide 80 mg twice daily.    he complains of a cough today. No fever, chills, orthopnea, or PND. He has chronic insomnia and complains of continued difficulty with sleep. He has not had any recurrence of leg or abdominal swelling.  Past Medical History  Diagnosis Date  . CAD (coronary artery disease) 2008    s/p CABG  . HYPERLIPIDEMIA-MIXED   . RENAL ARTERY STENOSIS   . Old MI (myocardial infarction) 2008  . Ischemic cardiomyopathy     a. has refused CRT-D in past;  b. echo 4/16: EF 25-30%, diffuse HK, no aortic stenosis, s/p MV repair with residual mild MR, mild LAE, moderate RAE, mild RVE, moderate TR, PASP 55    Past Surgical History  Procedure Laterality Date  . Coronary artery bypass graft  2008    LIMA-LAD, SVG-D1, SVG-OM1, SVG-PDA, MV repair w/ 30 mm Physio annuloplasty ring  . Mitral valve annuloplasty  2008    30 mm Physio ring    Current Outpatient Prescriptions  Medication Sig Dispense Refill  . ADVAIR DISKUS 250-50 MCG/DOSE AEPB Take 1 Inhaler by mouth daily as needed (for wheezing & coughing).   0  .  allopurinol (ZYLOPRIM) 100 MG tablet Take 200 mg by mouth daily.     Marland Kitchen aspirin 81 MG tablet Take 81 mg by mouth daily.      . carvedilol (COREG) 6.25 MG tablet Take 6.25 mg by mouth 2 (two) times daily.    . furosemide (LASIX) 80 MG tablet Take 1 tablet (80 mg total) by mouth 2 (two) times daily. 60 tablet 11  . Ipratropium-Albuterol (COMBIVENT) 20-100 MCG/ACT AERS respimat Inhale 1 puff into the lungs 4 (four) times daily as needed for wheezing or shortness of breath.     . potassium chloride SA (KLOR-CON M20) 20 MEQ tablet Take 1 tablet (20 mEq total) by mouth 2 (two) times daily. 60 tablet 11  . simvastatin (ZOCOR) 40 MG tablet take 1 tablet by mouth daily every evening 90 tablet 1  . metolazone (ZAROXOLYN) 2.5 MG tablet Take one tablet by mouth as needed 30 minutes prior to your morning dosage of Furosemide for weight gain 30 tablet 3   No current facility-administered medications for this visit.    Allergies:   Review of patient's allergies indicates no known allergies.   Social History:  The patient  reports that he has quit smoking. He does not have any smokeless tobacco history on file. He reports that he does not drink alcohol or use illicit drugs.   Family History:  The patient's  family history  includes Cancer in his paternal grandfather; Coronary artery disease in his other; Diabetes in his brother; Heart attack in his father; Hypertension in his father; Stroke in his mother.    ROS:  Please see the history of present illness.  Otherwise, review of systems is positive for fatigue, weakness.  All other systems are reviewed and negative.    PHYSICAL EXAM: VS:  BP 90/60 mmHg  Pulse 70  Ht 5\' 9"  (1.753 m)  Wt 165 lb 12.8 oz (75.206 kg)  BMI 24.47 kg/m2  SpO2 97% , BMI Body mass index is 24.47 kg/(m^2). GEN: chronically ill-appearing male, in no acute distress HEENT: normal Neck: no JVD, no masses. No carotid bruits Cardiac: RRR with 2/6 systolic ejection murmur at the RUSB               Respiratory:  clear to auscultation bilaterally, normal work of breathing GI: soft, nontender, nondistended, + BS MS: no deformity or atrophy Ext: no pretibial edema Skin: warm and dry, no rash Neuro:  Strength and sensation are intact Psych: euthymic mood, full affect  EKG:  EKG is not ordered today.  Recent Labs: 08/30/2014: Magnesium 2.5 01/26/2015: B Natriuretic Peptide 884.1*; Hemoglobin 11.4*; Platelets 118* 02/02/2015: BUN 37*; Creatinine, Ser 2.08*; Potassium 4.0; Sodium 142   Lipid Panel     Component Value Date/Time   CHOL 105 09/26/2012 0925   TRIG 77.0 09/26/2012 0925   HDL 42.40 09/26/2012 0925   CHOLHDL 2 09/26/2012 0925   VLDL 15.4 09/26/2012 0925   LDLCALC 47 09/26/2012 0925   LDLDIRECT 91.2 03/22/2009 1119      Wt Readings from Last 3 Encounters:  06/01/15 165 lb 12.8 oz (75.206 kg)  02/02/15 165 lb (74.844 kg)  01/26/15 167 lb (75.751 kg)     Cardiac Studies Reviewed: 2D Echo 09/03/2014: Study Conclusions  - Left ventricle: The cavity size was moderately dilated. Wall thickness was normal. Systolic function was severely reduced. The estimated ejection fraction was in the range of 25% to 30%. Diffuse hypokinesis. - Aortic valve: Small systolic gradient no stenosis. - Mitral valve: Post repair and annuloplasty ring mild residual MR. - Left atrium: The atrium was mildly dilated. - Right ventricle: The cavity size was mildly dilated. - Right atrium: The atrium was moderately dilated. - Atrial septum: No defect or patent foramen ovale was identified. - Tricuspid valve: There was moderate regurgitation. - Pulmonary arteries: PA peak pressure: 55 mm Hg (S). - Impressions: E/E&' elevated but not reflective of EDP with annuloplasty ring.  Impressions:  - E/E&' elevated but not reflective of EDP with annuloplasty ring.  ASSESSMENT AND PLAN: 1.  Chronic systolic heart failure, NYHA III: pt with severe ischemic cardiomyopathy and advanced  heart failure. Medical therapy limited by chronic hypotension and CKD. He is no longer a candidate for ACE/ARB or spironolactone because of risk of AKI on background of CKD. He will continue on carvedilol and furosemide.  I have written a prescription for metoloazone 2.5 mg to be taken as needed for weight gain of 5# or greater. He was counseled extensively today about sodium restriction, daily weights, and medication adherence in the setting of advanced heart failure. He is not a candidate for advanced CHF therapies. He has declined EP referral for consideration of CRT-D in the past.   2. CAD, native vessel: no angina at present. Continue ASA, statin drug.  Note labs are followed by his PCP and these are reviewed today through CareEverywhere.  Current medicines are reviewed with  the patient today.  The patient does not have concerns regarding medicines.  Labs/ tests ordered today include:  No orders of the defined types were placed in this encounter.   Disposition:   FU 4 months  Signed, Tonny Bollmanooper, Andjela Wickes, MD  06/02/2015 9:17 AM    [facility] [address], [place], [place]  [address] Phone: [phone]; Fax: [phone]

## 2015-06-01 NOTE — Patient Instructions (Addendum)
Medication Instructions:  Your physician has recommended you make the following change in your medication:  Continue current medications. Please take Metolazone 2.5mg  30 minutes prior to your morning dosage of Furosemide as needed for weight gain of more than 5 lbs from baseline weight of 165 lbs  Labwork: No new orders.   Testing/Procedures: No new orders.   Follow-Up: Your physician wants you to follow-up in: 4 MONTHS with Dr Excell Seltzerooper.  You will receive a reminder letter in the mail two months in advance. If you don't receive a letter, please call our office to schedule the follow-up appointment.   Any Other Special Instructions Will Be Listed Below (If Applicable).     If you need a refill on your cardiac medications before your next appointment, please call your pharmacy.

## 2015-08-29 ENCOUNTER — Other Ambulatory Visit (HOSPITAL_COMMUNITY): Payer: Self-pay | Admitting: *Deleted

## 2015-09-06 ENCOUNTER — Other Ambulatory Visit (HOSPITAL_COMMUNITY): Payer: Self-pay | Admitting: *Deleted

## 2015-09-30 ENCOUNTER — Other Ambulatory Visit (HOSPITAL_COMMUNITY): Payer: Self-pay | Admitting: *Deleted

## 2015-10-11 ENCOUNTER — Other Ambulatory Visit (HOSPITAL_COMMUNITY): Payer: Self-pay | Admitting: *Deleted

## 2015-10-11 ENCOUNTER — Other Ambulatory Visit: Payer: Self-pay | Admitting: *Deleted

## 2015-10-11 MED ORDER — CARVEDILOL 6.25 MG PO TABS: 6.2500 mg | ORAL_TABLET | Freq: Two times a day (BID) | ORAL | Status: DC

## 2015-10-19 ENCOUNTER — Encounter (HOSPITAL_COMMUNITY): Payer: Self-pay

## 2015-10-19 ENCOUNTER — Observation Stay (HOSPITAL_COMMUNITY)
Admission: EM | Admit: 2015-10-19 | Discharge: 2015-10-21 | Disposition: A | Discharge status: Home / Self Care | Payer: Medicare Other | Attending: Internal Medicine | Admitting: Internal Medicine

## 2015-10-19 ENCOUNTER — Emergency Department (HOSPITAL_COMMUNITY): Payer: Medicare Other

## 2015-10-19 DIAGNOSIS — I5022 Chronic systolic (congestive) heart failure: Secondary | ICD-10-CM | POA: Diagnosis present

## 2015-10-19 DIAGNOSIS — E876 Hypokalemia: Secondary | ICD-10-CM | POA: Diagnosis not present

## 2015-10-19 DIAGNOSIS — N179 Acute kidney failure, unspecified: Secondary | ICD-10-CM | POA: Diagnosis not present

## 2015-10-19 DIAGNOSIS — R112 Nausea with vomiting, unspecified: Secondary | ICD-10-CM | POA: Insufficient documentation

## 2015-10-19 DIAGNOSIS — E785 Hyperlipidemia, unspecified: Secondary | ICD-10-CM | POA: Diagnosis not present

## 2015-10-19 DIAGNOSIS — Z79899 Other long term (current) drug therapy: Secondary | ICD-10-CM | POA: Diagnosis not present

## 2015-10-19 DIAGNOSIS — Z7982 Long term (current) use of aspirin: Secondary | ICD-10-CM | POA: Insufficient documentation

## 2015-10-19 DIAGNOSIS — I701 Atherosclerosis of renal artery: Secondary | ICD-10-CM | POA: Diagnosis not present

## 2015-10-19 DIAGNOSIS — I252 Old myocardial infarction: Secondary | ICD-10-CM | POA: Diagnosis not present

## 2015-10-19 DIAGNOSIS — J441 Chronic obstructive pulmonary disease with (acute) exacerbation: Secondary | ICD-10-CM | POA: Diagnosis not present

## 2015-10-19 DIAGNOSIS — K0889 Other specified disorders of teeth and supporting structures: Secondary | ICD-10-CM | POA: Insufficient documentation

## 2015-10-19 DIAGNOSIS — I5023 Acute on chronic systolic (congestive) heart failure: Secondary | ICD-10-CM | POA: Insufficient documentation

## 2015-10-19 DIAGNOSIS — I2510 Atherosclerotic heart disease of native coronary artery without angina pectoris: Secondary | ICD-10-CM | POA: Diagnosis not present

## 2015-10-19 DIAGNOSIS — R42 Dizziness and giddiness: Secondary | ICD-10-CM | POA: Insufficient documentation

## 2015-10-19 DIAGNOSIS — I25810 Atherosclerosis of coronary artery bypass graft(s) without angina pectoris: Secondary | ICD-10-CM | POA: Diagnosis present

## 2015-10-19 DIAGNOSIS — Z87891 Personal history of nicotine dependence: Secondary | ICD-10-CM | POA: Diagnosis not present

## 2015-10-19 DIAGNOSIS — R5383 Other fatigue: Secondary | ICD-10-CM | POA: Diagnosis not present

## 2015-10-19 DIAGNOSIS — F17200 Nicotine dependence, unspecified, uncomplicated: Secondary | ICD-10-CM | POA: Diagnosis present

## 2015-10-19 DIAGNOSIS — D649 Anemia, unspecified: Secondary | ICD-10-CM | POA: Diagnosis present

## 2015-10-19 DIAGNOSIS — I10 Essential (primary) hypertension: Secondary | ICD-10-CM | POA: Diagnosis present

## 2015-10-19 DIAGNOSIS — R531 Weakness: Secondary | ICD-10-CM | POA: Diagnosis present

## 2015-10-19 DIAGNOSIS — M109 Gout, unspecified: Secondary | ICD-10-CM | POA: Diagnosis present

## 2015-10-19 HISTORY — DX: Chronic obstructive pulmonary disease, unspecified: J44.9

## 2015-10-19 LAB — I-STAT TROPONIN, ED: Troponin i, poc: 0.04 ng/mL (ref 0.00–0.08)

## 2015-10-19 LAB — CBC
HEMATOCRIT: 36 % — AB (ref 39.0–52.0)
Hemoglobin: 10.8 g/dL — ABNORMAL LOW (ref 13.0–17.0)
MCH: 27.8 pg (ref 26.0–34.0)
MCHC: 30 g/dL (ref 30.0–36.0)
MCV: 92.5 fL (ref 78.0–100.0)
PLATELETS: 144 10*3/uL — AB (ref 150–400)
RBC: 3.89 MIL/uL — ABNORMAL LOW (ref 4.22–5.81)
RDW: 18.9 % — AB (ref 11.5–15.5)
WBC: 7.6 10*3/uL (ref 4.0–10.5)

## 2015-10-19 LAB — MAGNESIUM: MAGNESIUM: 2.7 mg/dL — AB (ref 1.7–2.4)

## 2015-10-19 LAB — BASIC METABOLIC PANEL
Anion gap: 10 (ref 5–15)
BUN: 94 mg/dL — AB (ref 6–20)
CHLORIDE: 95 mmol/L — AB (ref 101–111)
CO2: 30 mmol/L (ref 22–32)
CREATININE: 3.31 mg/dL — AB (ref 0.61–1.24)
Calcium: 9.2 mg/dL (ref 8.9–10.3)
GFR calc Af Amer: 21 mL/min — ABNORMAL LOW (ref >60)
GFR calc non Af Amer: 18 mL/min — ABNORMAL LOW (ref >60)
GLUCOSE: 112 mg/dL — AB (ref 65–99)
POTASSIUM: 2.6 mmol/L — AB (ref 3.5–5.1)
SODIUM: 135 mmol/L (ref 135–145)

## 2015-10-19 LAB — BRAIN NATRIURETIC PEPTIDE: B Natriuretic Peptide: 549 pg/mL — ABNORMAL HIGH (ref 0.0–100.0)

## 2015-10-19 MED ORDER — POTASSIUM CHLORIDE IN NACL 40-0.9 MEQ/L-% IV SOLN
INTRAVENOUS | Status: DC
  Administered 2015-10-19: 100 mL/h via INTRAVENOUS
  Filled 2015-10-19 (×2): qty 1000

## 2015-10-19 MED ORDER — IPRATROPIUM-ALBUTEROL 0.5-2.5 (3) MG/3ML IN SOLN: 3.0000 mL | Freq: Four times a day (QID) | RESPIRATORY_TRACT | Status: DC | PRN

## 2015-10-19 MED ORDER — SODIUM CHLORIDE 0.9 % IV BOLUS (SEPSIS)
250.0000 mL | Freq: Once | INTRAVENOUS | Status: AC
  Administered 2015-10-19: 250 mL via INTRAVENOUS

## 2015-10-19 MED ORDER — MOMETASONE FURO-FORMOTEROL FUM 200-5 MCG/ACT IN AERO
2.0000 | INHALATION_SPRAY | Freq: Two times a day (BID) | RESPIRATORY_TRACT | Status: DC
  Administered 2015-10-20 (×2): 2 via RESPIRATORY_TRACT
  Filled 2015-10-19: qty 8.8

## 2015-10-19 MED ORDER — ENOXAPARIN SODIUM 30 MG/0.3ML ~~LOC~~ SOLN
30.0000 mg | Freq: Every day | SUBCUTANEOUS | Status: DC
  Administered 2015-10-19 – 2015-10-20 (×2): 30 mg via SUBCUTANEOUS
  Filled 2015-10-19 (×2): qty 0.3

## 2015-10-19 MED ORDER — ONDANSETRON HCL 4 MG/2ML IJ SOLN: 4.0000 mg | Freq: Four times a day (QID) | INTRAMUSCULAR | Status: DC | PRN

## 2015-10-19 MED ORDER — POTASSIUM CHLORIDE 20 MEQ PO PACK
40.0000 meq | PACK | Freq: Two times a day (BID) | ORAL | Status: DC
  Filled 2015-10-19: qty 2

## 2015-10-19 MED ORDER — SODIUM CHLORIDE 0.9% FLUSH
3.0000 mL | Freq: Two times a day (BID) | INTRAVENOUS | Status: DC
  Administered 2015-10-20 – 2015-10-21 (×4): 3 mL via INTRAVENOUS

## 2015-10-19 MED ORDER — ONDANSETRON HCL 4 MG PO TABS: 4.0000 mg | ORAL_TABLET | Freq: Four times a day (QID) | ORAL | Status: DC | PRN

## 2015-10-19 MED ORDER — POTASSIUM CHLORIDE 10 MEQ/100ML IV SOLN
10.0000 meq | Freq: Once | INTRAVENOUS | Status: AC
  Administered 2015-10-19: 10 meq via INTRAVENOUS
  Filled 2015-10-19: qty 100

## 2015-10-19 NOTE — H&P (Signed)
History and Physical    Nicholas Stanley WUJ:[id number] DOB: 06/01/1945 DOA: 10/19/2015  PCP: Alois ClicheSohail N Bazel, MD   Patient coming from:   Chief Complaint: Nausea, emesis and weakness.  HPI: Nicholas SorrowDexter Schuchard is a 70 y.o. male with medical history significant of CAD, chronic systolic CHF, COPD, hyperlipidemia, hypertension, treatment noncompliance per PCP, comes to the emergency department due to recurrence of intermittent nausea and emesis.   Per patient, he has been having this issue for a few years. This last episode started about 2 weeks ago, he initially had some emesis, but since then he has had persistent nausea, occasional epigastric discomfort, weakness and postural dizziness. He denies fever, chills, diarrhea, constipation, melena or hematochezia. He went to [facility] emergency department last night, admission was recommended due to worsening renal function and hypokalemia, but the patient signed AMA.   He was followed by his PCP today, who recommended to the patient to return to the emergency department. He denies chest pain, palpitations, diaphoresis, pitting edema all the lower extremities, PND, but still complains of dizziness and orthopnea.  ED Course: Workup in the emergency department shows anemia, hypokalemia 2.6 mmol per liter, elevated BNP and worsening renal failure. Patient states that he is feeling a little better with treatment.  Review of Systems: As per HPI otherwise 10 point review of systems negative.   Past Medical History  Diagnosis Date  . CAD (coronary artery disease) 2008    s/p CABG  . HYPERLIPIDEMIA-MIXED   . RENAL ARTERY STENOSIS   . Old MI (myocardial infarction) 2008  . Ischemic cardiomyopathy     a. has refused CRT-D in past;  b. echo 4/16: EF 25-30%, diffuse HK, no aortic stenosis, s/p MV repair with residual mild MR, mild LAE, moderate RAE, mild RVE, moderate TR, PASP 55  . COPD (chronic obstructive pulmonary disease) [facility](HCC)     Past Surgical History   Procedure Laterality Date  . Coronary artery bypass graft  2008    LIMA-LAD, SVG-D1, SVG-OM1, SVG-PDA, MV repair w/ 30 mm Physio annuloplasty ring  . Mitral valve annuloplasty  2008    30 mm Physio ring     reports that he has quit smoking. He does not have any smokeless tobacco history on file. He reports that he does not drink alcohol or use illicit drugs.  No Known Allergies  Family History  Problem Relation Age of Onset  . Coronary artery disease Other   . Stroke Mother   . Heart attack Father   . Diabetes Brother   . Cancer Paternal Grandfather   . Hypertension Father      Prior to Admission medications   Medication Sig Start Date End Date Taking? Authorizing Provider  allopurinol (ZYLOPRIM) 100 MG tablet Take 200 mg by mouth daily.  10/22/12  Yes Historical Provider, MD  aspirin 81 MG tablet Take 81 mg by mouth daily.     Yes Historical Provider, MD  carvedilol (COREG) 6.25 MG tablet Take 1 tablet (6.25 mg total) by mouth 2 (two) times daily. 10/11/15  Yes Tonny BollmanMichael Cooper, MD  furosemide (LASIX) 80 MG tablet Take 1 tablet (80 mg total) by mouth 2 (two) times daily. 02/02/15  Yes Rhonda G Barrett, PA-C  metolazone (ZAROXOLYN) 2.5 MG tablet Take one tablet by mouth as needed 30 minutes prior to your morning dosage of Furosemide for weight gain 06/01/15  Yes Tonny BollmanMichael Cooper, MD  potassium chloride SA (KLOR-CON M20) 20 MEQ tablet Take 1 tablet (20 mEq total) by  mouth 2 (two) times daily. 08/18/14  Yes Tonny Bollman, MD  simvastatin (ZOCOR) 40 MG tablet take 1 tablet by mouth daily every evening Patient taking differently: Take 40 mg by mouth every evening.  09/18/12  Yes Tonny Bollman, MD  ADVAIR DISKUS 250-50 MCG/DOSE AEPB Take 1 puff by mouth 2 (two) times daily. Reported on 10/19/2015 05/06/15   Historical Provider, MD  Ipratropium-Albuterol (COMBIVENT) 20-100 MCG/ACT AERS respimat Inhale 1 puff into the lungs 4 (four) times daily as needed for wheezing or shortness of breath. Reported  on 10/19/2015 03/09/15   Historical Provider, MD    Physical Exam: Filed Vitals:   10/19/15 1949 10/19/15 2030 10/19/15 2100 10/19/15 2205  BP: 90/61 93/74 89/73  91/68  Pulse: 62 65 64 66  Temp:      Resp: 22 13 17 18   Height:      Weight:    69.128 kg (152 lb 6.4 oz)  SpO2: 99% 99% 100% 100%      Constitutional: NAD, calm, comfortable Filed Vitals:   10/19/15 1949 10/19/15 2030 10/19/15 2100 10/19/15 2205  BP: 90/61 93/74 89/73  91/68  Pulse: 62 65 64 66  Temp:      Resp: 22 13 17 18   Height:      Weight:    69.128 kg (152 lb 6.4 oz)  SpO2: 99% 99% 100% 100%   Eyes: PERRL, lids and conjunctivae normal ENMT: Mucous membranes are moist. Posterior pharynx clear of any exudate or lesions.Dentition is in poor state of repair Neck: normal, supple, no masses, no thyromegaly Respiratory: Bilateral wheezing, no crackles. Normal respiratory effort. No accessory muscle use.  Cardiovascular: Regular rate and rhythm, no murmurs / rubs / gallops. No extremity edema. 2+ pedal pulses.  Abdomen: Positive mild epigastric tenderness, positive epigastric area incisional hernia, positive left inguinal hernia easily                             reducible. No guarding, no rebound. No hepatosplenomegaly. Bowel sounds positive.  Musculoskeletal: No clubbing / cyanosis. No joint deformity upper and lower extremities.                              Good ROM, no contractures. Normal muscle tone.  Skin: Positive cystic lesion on the left shoulder and lipomas on back. Neurologic: CN 2-12 grossly intact. Sensation intact, DTR normal. Strength 5/5 in all 4.  Psychiatric: Normal judgment and insight. Alert and oriented x 4. Normal mood.     Labs on Admission: I have personally reviewed following labs and imaging studies  CBC:  Recent Labs Lab 10/19/15 1718  WBC 7.6  HGB 10.8*  HCT 36.0*  MCV 92.5  PLT 144*   Basic Metabolic Panel:  Recent Labs Lab 10/19/15 1718  NA 135  K 2.6*  CL 95*  CO2  30  GLUCOSE 112*  BUN 94*  CREATININE 3.31*  CALCIUM 9.2  MG 2.7*   GFR: Estimated Creatinine Clearance: 20.9 mL/min (by C-G formula based on Cr of 3.31).   Radiological Exams on Admission: Dg Chest 2 View  10/19/2015  CLINICAL DATA:  Acute onset of generalized weakness and nausea. Hypokalemia. Initial encounter. EXAM: CHEST  2 VIEW COMPARISON:  Chest radiograph from 01/26/2015 FINDINGS: The lungs are well-aerated. Vascular congestion is noted. is no evidence of focal opacification, pleural effusion or pneumothorax. The heart is mildly enlarged. The patient is status post  median sternotomy, with evidence of prior CABG. A valve replacement is noted. No acute osseous abnormalities are seen. IMPRESSION: Vascular congestion and mild cardiomegaly. Lungs remain grossly clear. Electronically Signed   By: Roanna Raider M.D.   On: 10/19/2015 18:27    EKG: Independently reviewed.  Vent. rate 67 BPM PR interval 184 ms QRS duration 188 ms QT/QTc 508/536 ms P-R-T axes 37 -44 100 Sinus rhythm with Premature atrial complexes with Abberant conduction Left axis deviation Left bundle branch block Abnormal ECG  Assessment/Plan Principal Problem:   Hypokalemia Admit to telemetry/observation. Continue potassium supplementation. Check magnesium level and replace as needed. Follow-up potassium level in a.m.    Nausea and vomiting. Antiemetics as needed. Careful and time-limited IV hydration with potassium supplementation. Recommended GI evaluation as an outpatient, since the patient has chronic GI symptoms.  Active Problems:   CAD, ARTERY BYPASS GRAFT Continue aspirin and simvastatin. Hold carvedilol due to hypotension.    SYSTOLIC HEART FAILURE, CHRONIC Clinically compensated. The patient has an elevated BNP, but also has worsening azotemia. Furosemide and carvedilol have been held due to hypotension. Monitor intake and output.    Anemia Check anemia panel. Monitor H&H.     Gout Continue allopurinol.    Hyperlipidemia Continue simvastatin 40 mg by mouth daily. Monitor LFTs periodically.    Essential hypertension Hold carvedilol and furosemide for now due to hypotension. Monitor blood pressure closely    Tobacco use disorder Declined nicotine replacement therapy.    AKI (acute kidney injury) (HCC) Gentle and time-limited IV hydration Check urine random sodium level. Follow-up BUN and creatinine. Consider expanding workup if no improvement.   DVT prophylaxis: Lovenox SQ. Code Status: Full code. Family Communication:  Disposition Plan: Admit to telemetry monitoring for potassium replacement. Consults called:  Admission status: Telemetry/observation.   Bobette Mo MD [facility] Pager [phone].  If 7PM-7AM, please contact night-coverage [email or web address] Password [email or web address]  10/19/2015, 10:14 PM

## 2015-10-19 NOTE — ED Provider Notes (Signed)
CSN: [id number]     Arrival date & time 10/19/15  1700 History   First MD Initiated Contact with Patient 10/19/15 2016     Chief Complaint  Patient presents with  . Nausea  . Weakness   The history is provided by the patient, a relative and medical records. No language interpreter was used.   Patient is a 70 year old male with past medical history of coronary artery disease, status post CABG, MI, chronic kidney disease, hypertension, hyperlipidemia, COPD, ischemic cardiomyopathy with last EF 15% presents with nausea and generalized weakness. Patient reports he has been having intermittent nausea and vomiting for a while like characterize exactly how long. This worsened over the past few days. Patient reports nausea but is unable to bring anything up. He has some associated generalized weakness, fatigue and feels lightheaded with standing up and walking. Also reports some symptoms of reflux with eating. He has not tried any medicines in particular at home. Denies headache, fevers, chills, chest pain, cough, changes in his bowel movements. Reports he is chronically short of breath with ambulation and with lying flat. This is unchanged. Although patient has multiple visits documenting non-adherence to medications/treatment plans, he states he has been taking all his medicines as prescribed. He thinks that he has been losing weight and his son agrees. He has not had swelling in his legs or increased abdominal distention.   Of note, patient was evaluated for the same symptoms at [facility] ED last night. Lab workup at that time significant for anemia with hemoglobin 11.4, lipase mildly elevated at 53, hypokalemia with potassium 2.5, hypochloremia at 95, elevated BUN and creatinine at 93 and 3.26 respectively, elevated alkaline phosphatase at 98, normal liver enzymes. Troponin mildly elevated at 0.08. Chest x-ray unremarkable. Plan was to admit patient to the hospital but patient left AMA. He did receive  potassium repletion before he left. He followed up with his primary care doctor today who felt the patient appeared very ill and advised to return to the emergency department for admission. Of note, patient was noted to be very nonadherent his medications and treatment plans per primary care doctor.  Past Medical History  Diagnosis Date  . CAD (coronary artery disease) 2008    s/p CABG  . HYPERLIPIDEMIA-MIXED   . RENAL ARTERY STENOSIS   . Old MI (myocardial infarction) 2008  . Ischemic cardiomyopathy     a. has refused CRT-D in past;  b. echo 4/16: EF 25-30%, diffuse HK, no aortic stenosis, s/p MV repair with residual mild MR, mild LAE, moderate RAE, mild RVE, moderate TR, PASP 55  . COPD (chronic obstructive pulmonary disease) [facility])    Past Surgical History  Procedure Laterality Date  . Coronary artery bypass graft  2008    LIMA-LAD, SVG-D1, SVG-OM1, SVG-PDA, MV repair w/ 30 mm Physio annuloplasty ring  . Mitral valve annuloplasty  2008    30 mm Physio ring   Family History  Problem Relation Age of Onset  . Coronary artery disease Other   . Stroke Mother   . Heart attack Father   . Diabetes Brother   . Cancer Paternal Grandfather   . Hypertension Father    Social History  Substance Use Topics  . Smoking status: Former Games developer  . Smokeless tobacco: None     Comment: PATIENT HAD NOT SMOKE FOR TWO MONTHS  . Alcohol Use: No    Review of Systems  Constitutional: Positive for fatigue and unexpected weight change (losing weight). Negative for fever and  chills.  HENT: Negative for congestion and rhinorrhea.   Eyes: Negative for visual disturbance.  Respiratory: Negative for cough and choking.   Cardiovascular: Negative for chest pain and leg swelling.  Gastrointestinal: Positive for nausea and vomiting. Negative for abdominal pain, diarrhea and blood in stool.  Genitourinary: Negative for dysuria and difficulty urinating.  Musculoskeletal: Negative for myalgias, back pain and  neck pain.  Skin: Negative for pallor and rash.  Neurological: Positive for weakness and light-headedness. Negative for headaches.  Psychiatric/Behavioral: Negative for confusion.    Allergies  Review of patient's allergies indicates no known allergies.  Home Medications   Prior to Admission medications   Medication Sig Start Date End Date Taking? Authorizing Provider  ADVAIR DISKUS 250-50 MCG/DOSE AEPB Take 1 Inhaler by mouth daily as needed (for wheezing & coughing).  05/06/15   Historical Provider, MD  allopurinol (ZYLOPRIM) 100 MG tablet Take 200 mg by mouth daily.  10/22/12   Historical Provider, MD  aspirin 81 MG tablet Take 81 mg by mouth daily.      Historical Provider, MD  carvedilol (COREG) 6.25 MG tablet Take 1 tablet (6.25 mg total) by mouth 2 (two) times daily. 10/11/15   Tonny BollmanMichael Cooper, MD  furosemide (LASIX) 80 MG tablet Take 1 tablet (80 mg total) by mouth 2 (two) times daily. 02/02/15   Rhonda G Barrett, PA-C  Ipratropium-Albuterol (COMBIVENT) 20-100 MCG/ACT AERS respimat Inhale 1 puff into the lungs 4 (four) times daily as needed for wheezing or shortness of breath.  03/09/15   Historical Provider, MD  metolazone (ZAROXOLYN) 2.5 MG tablet Take one tablet by mouth as needed 30 minutes prior to your morning dosage of Furosemide for weight gain 06/01/15   Tonny BollmanMichael Cooper, MD  potassium chloride SA (KLOR-CON M20) 20 MEQ tablet Take 1 tablet (20 mEq total) by mouth 2 (two) times daily. 08/18/14   Tonny BollmanMichael Cooper, MD  simvastatin (ZOCOR) 40 MG tablet take 1 tablet by mouth daily every evening 09/18/12   Tonny BollmanMichael Cooper, MD   BP 90/61 mmHg  Pulse 62  Temp(Src) 97.8 F (36.6 C)  Resp 22  Ht 5\' 9"  (1.753 m)  Wt 72.576 kg  BMI 23.62 kg/m2  SpO2 99% Physical Exam  Constitutional: He is oriented to person, place, and time.  Chronically ill appearing  HENT:  Head: Normocephalic and atraumatic.  Mouth/Throat: Abnormal dentition.  Eyes: EOM are normal. Pupils are equal, round, and  reactive to light.  Neck: Normal range of motion. Neck supple.  Cardiovascular: Normal rate, regular rhythm and intact distal pulses.   Pulmonary/Chest: Effort normal. No respiratory distress. He has wheezes (faint scattered expiratory wheezes). He has no rales.  Abdominal: Soft. He exhibits no distension. There is no tenderness.  Musculoskeletal: Normal range of motion. He exhibits no edema or tenderness.  Neurological: He is alert and oriented to person, place, and time.  Skin: Skin is warm and dry. No rash noted.  3 palpable masses on back, likely lipomas, that are non-tender, without erythema, or induration (patient states he has had these for a long time)  Psychiatric: He has a normal mood and affect.  Nursing note and vitals reviewed.   ED Course  Procedures (including critical care time) Labs Review Labs Reviewed  BASIC METABOLIC PANEL - Abnormal; Notable for the following:    Potassium 2.6 (*)    Chloride 95 (*)    Glucose, Bld 112 (*)    BUN 94 (*)    Creatinine, Ser 3.31 (*)  GFR calc non Af Amer 18 (*)    GFR calc Af Amer 21 (*)    All other components within normal limits  CBC - Abnormal; Notable for the following:    RBC 3.89 (*)    Hemoglobin 10.8 (*)    HCT 36.0 (*)    RDW 18.9 (*)    Platelets 144 (*)    All other components within normal limits  URINALYSIS, ROUTINE W REFLEX MICROSCOPIC (NOT AT [facility])  Rosezena Sensor, ED    Imaging Review Dg Chest 2 View  10/19/2015  CLINICAL DATA:  Acute onset of generalized weakness and nausea. Hypokalemia. Initial encounter. EXAM: CHEST  2 VIEW COMPARISON:  Chest radiograph from 01/26/2015 FINDINGS: The lungs are well-aerated. Vascular congestion is noted. is no evidence of focal opacification, pleural effusion or pneumothorax. The heart is mildly enlarged. The patient is status post median sternotomy, with evidence of prior CABG. A valve replacement is noted. No acute osseous abnormalities are seen. IMPRESSION: Vascular  congestion and mild cardiomegaly. Lungs remain grossly clear. Electronically Signed   By: Roanna Raider M.D.   On: 10/19/2015 18:27   I have personally reviewed and evaluated these images and lab results as part of my medical decision-making.   EKG Interpretation   Date/Time:  Wednesday Oct 19 2015 17:13:48 EDT Ventricular Rate:  67 PR Interval:  184 QRS Duration: 188 QT Interval:  508 QTC Calculation: 536 R Axis:   -44 Text Interpretation:  Sinus rhythm with Premature atrial complexes with  Abberant conduction Left axis deviation Left bundle branch block Abnormal  ECG known LBBB, with PVCs but similar to prior Confirmed by LIU MD, DANA  ([id number]) on 10/19/2015 5:23:37 PM      MDM   Final diagnoses:  Hypokalemia  AKI (acute kidney injury) (HCC)  Non-intractable vomiting with nausea, vomiting of unspecified type    Patient with multiple medical comorbidities presents from PCPs office for evaluation of nausea, vomiting and hypokalemia. Patient is borderline hypotensive with SBPs in 90s, which appears baseline on review of past visits. Otherwise VS unremarkable. Exam as above. Appears dehydrated. No signs of fluid overload. Outside medical records reviewed, as above.  EKG without acute changes. CXR does not show pulmonary edema. Labs significant for hypokalemia, AKI, and anemia. Patient given IV and po potassium replacement. Magnesium 2.7. As patient low EF CHF, small IV fluid 250cc bolus given, which he tolerated well.  Hospitalist consulted for admission and triage of patient. Plan to admit to Dr. Robb Matar for further electrolyte repletion, gentle IV hydration, and monitoring.   Patient seen and discussed with Dr. Verdie Mosher, ED attending.   Isa Rankin, MD 10/20/15 1914  Lavera Guise, MD 10/20/15 [phone]

## 2015-10-19 NOTE — ED Notes (Signed)
Patient here with ongoing weakness and nausea. [facility] pm and received fluids and told potasium was low and treated for same. Was told to come to ED today for admission

## 2015-10-19 NOTE — Progress Notes (Signed)
Admission note:  Arrival Method: Pt arrived on stretcher from ED Mental Orientation: Alert and oriented x 4 Telemetry: Telemetry box 6e29 applied. CCMT notified. Verified with Alexus Threatt, NT.  Assessment: Completed, see flowsheets Skin: Dry and intact. Bruising to arms bilaterally. Three large raised areas to patient's back without drainage.  IV: Right forearm. Site is clean, dry and intact.  Pain: Patient states no pain at this time Tubes: N/A Safety Measures: Bed in lowest position, non-slip socks placed, call light within reach and bed alarm activated Fall Prevention Safety Plan: Reviewed with patient Admission Screening: Completed 6700 Orientation: Patient has been oriented to the unit, staff and to the room. Orders have been reviewed and implemented. Call light is within reach, will continue to monitor the patient closely.   Feliciana RossettiLaura Chancellor Vanderloop, RN, BSN

## 2015-10-20 ENCOUNTER — Observation Stay (HOSPITAL_BASED_OUTPATIENT_CLINIC_OR_DEPARTMENT_OTHER): Payer: Medicare Other

## 2015-10-20 DIAGNOSIS — I509 Heart failure, unspecified: Secondary | ICD-10-CM

## 2015-10-20 DIAGNOSIS — I10 Essential (primary) hypertension: Secondary | ICD-10-CM | POA: Diagnosis not present

## 2015-10-20 DIAGNOSIS — R112 Nausea with vomiting, unspecified: Secondary | ICD-10-CM

## 2015-10-20 DIAGNOSIS — I5022 Chronic systolic (congestive) heart failure: Secondary | ICD-10-CM | POA: Diagnosis not present

## 2015-10-20 DIAGNOSIS — N179 Acute kidney failure, unspecified: Secondary | ICD-10-CM

## 2015-10-20 DIAGNOSIS — I5023 Acute on chronic systolic (congestive) heart failure: Secondary | ICD-10-CM

## 2015-10-20 DIAGNOSIS — E876 Hypokalemia: Secondary | ICD-10-CM | POA: Diagnosis not present

## 2015-10-20 LAB — ECHOCARDIOGRAM COMPLETE
Height: 69 in
WEIGHTICAEL: 2438.4 [oz_av]

## 2015-10-20 LAB — BASIC METABOLIC PANEL
ANION GAP: 10 (ref 5–15)
BUN: 91 mg/dL — ABNORMAL HIGH (ref 6–20)
CHLORIDE: 101 mmol/L (ref 101–111)
CO2: 28 mmol/L (ref 22–32)
Calcium: 9.3 mg/dL (ref 8.9–10.3)
Creatinine, Ser: 2.95 mg/dL — ABNORMAL HIGH (ref 0.61–1.24)
GFR, EST AFRICAN AMERICAN: 24 mL/min — AB (ref >60)
GFR, EST NON AFRICAN AMERICAN: 20 mL/min — AB (ref >60)
Glucose, Bld: 85 mg/dL (ref 65–99)
POTASSIUM: 3.4 mmol/L — AB (ref 3.5–5.1)
SODIUM: 139 mmol/L (ref 135–145)

## 2015-10-20 LAB — IRON AND TIBC
IRON: 70 ug/dL (ref 45–182)
Saturation Ratios: 19 % (ref 17.9–39.5)
TIBC: 364 ug/dL (ref 250–450)
UIBC: 294 ug/dL

## 2015-10-20 LAB — CBC WITH DIFFERENTIAL/PLATELET
BASOS PCT: 1 %
Basophils Absolute: 0 10*3/uL (ref 0.0–0.1)
Eosinophils Absolute: 0.2 10*3/uL (ref 0.0–0.7)
Eosinophils Relative: 3 %
HEMATOCRIT: 36 % — AB (ref 39.0–52.0)
HEMOGLOBIN: 10.8 g/dL — AB (ref 13.0–17.0)
LYMPHS ABS: 1.2 10*3/uL (ref 0.7–4.0)
Lymphocytes Relative: 19 %
MCH: 28.1 pg (ref 26.0–34.0)
MCHC: 30 g/dL (ref 30.0–36.0)
MCV: 93.8 fL (ref 78.0–100.0)
MONOS PCT: 5 %
Monocytes Absolute: 0.3 10*3/uL (ref 0.1–1.0)
NEUTROS ABS: 4.8 10*3/uL (ref 1.7–7.7)
NEUTROS PCT: 72 %
Platelets: 138 10*3/uL — ABNORMAL LOW (ref 150–400)
RBC: 3.84 MIL/uL — AB (ref 4.22–5.81)
RDW: 18.8 % — ABNORMAL HIGH (ref 11.5–15.5)
WBC: 6.6 10*3/uL (ref 4.0–10.5)

## 2015-10-20 LAB — COMPREHENSIVE METABOLIC PANEL
ALBUMIN: 3.1 g/dL — AB (ref 3.5–5.0)
ALK PHOS: 100 U/L (ref 38–126)
ALT: 10 U/L — AB (ref 17–63)
ANION GAP: 12 (ref 5–15)
AST: 12 U/L — ABNORMAL LOW (ref 15–41)
BILIRUBIN TOTAL: 1.9 mg/dL — AB (ref 0.3–1.2)
BUN: 93 mg/dL — AB (ref 6–20)
CALCIUM: 9.2 mg/dL (ref 8.9–10.3)
CO2: 29 mmol/L (ref 22–32)
CREATININE: 3.21 mg/dL — AB (ref 0.61–1.24)
Chloride: 99 mmol/L — ABNORMAL LOW (ref 101–111)
GFR calc Af Amer: 21 mL/min — ABNORMAL LOW (ref >60)
GFR calc non Af Amer: 18 mL/min — ABNORMAL LOW (ref >60)
GLUCOSE: 118 mg/dL — AB (ref 65–99)
Potassium: 2.8 mmol/L — ABNORMAL LOW (ref 3.5–5.1)
Sodium: 140 mmol/L (ref 135–145)
TOTAL PROTEIN: 6.5 g/dL (ref 6.5–8.1)

## 2015-10-20 LAB — RETICULOCYTES
RBC.: 3.84 MIL/uL — ABNORMAL LOW (ref 4.22–5.81)
Retic Count, Absolute: 122.9 10*3/uL (ref 19.0–186.0)
Retic Ct Pct: 3.2 % — ABNORMAL HIGH (ref 0.4–3.1)

## 2015-10-20 LAB — FOLATE: Folate: 13.9 ng/mL (ref >5.9)

## 2015-10-20 LAB — URINALYSIS, ROUTINE W REFLEX MICROSCOPIC
BILIRUBIN URINE: NEGATIVE
Glucose, UA: NEGATIVE mg/dL
Hgb urine dipstick: NEGATIVE
KETONES UR: NEGATIVE mg/dL
Leukocytes, UA: NEGATIVE
NITRITE: NEGATIVE
PROTEIN: NEGATIVE mg/dL
Specific Gravity, Urine: 1.01 (ref 1.005–1.030)
pH: 6 (ref 5.0–8.0)

## 2015-10-20 LAB — SODIUM, URINE, RANDOM: SODIUM UR: 37 mmol/L

## 2015-10-20 LAB — FERRITIN: FERRITIN: 48 ng/mL (ref 24–336)

## 2015-10-20 LAB — VITAMIN B12: Vitamin B-12: 419 pg/mL (ref 180–914)

## 2015-10-20 MED ORDER — METOLAZONE 2.5 MG PO TABS
2.5000 mg | ORAL_TABLET | Freq: Every day | ORAL | Status: DC
  Administered 2015-10-20 – 2015-10-21 (×2): 2.5 mg via ORAL
  Filled 2015-10-20 (×2): qty 1

## 2015-10-20 MED ORDER — POTASSIUM CHLORIDE IN NACL 40-0.9 MEQ/L-% IV SOLN
INTRAVENOUS | Status: DC
  Administered 2015-10-20: 100 mL/h via INTRAVENOUS
  Filled 2015-10-20 (×2): qty 1000

## 2015-10-20 MED ORDER — ALLOPURINOL 100 MG PO TABS
200.0000 mg | ORAL_TABLET | Freq: Every day | ORAL | Status: DC
  Administered 2015-10-20 – 2015-10-21 (×2): 200 mg via ORAL
  Filled 2015-10-20 (×2): qty 2

## 2015-10-20 MED ORDER — PERFLUTREN LIPID MICROSPHERE
1.0000 mL | INTRAVENOUS | Status: AC | PRN
  Administered 2015-10-20: 2 mL via INTRAVENOUS
  Filled 2015-10-20: qty 10

## 2015-10-20 MED ORDER — POTASSIUM CHLORIDE CRYS ER 20 MEQ PO TBCR: 20.0000 meq | EXTENDED_RELEASE_TABLET | Freq: Two times a day (BID) | ORAL | Status: DC

## 2015-10-20 MED ORDER — ASPIRIN EC 81 MG PO TBEC
81.0000 mg | DELAYED_RELEASE_TABLET | Freq: Every day | ORAL | Status: DC
  Administered 2015-10-20 – 2015-10-21 (×2): 81 mg via ORAL
  Filled 2015-10-20 (×2): qty 1

## 2015-10-20 MED ORDER — POTASSIUM CHLORIDE CRYS ER 20 MEQ PO TBCR
40.0000 meq | EXTENDED_RELEASE_TABLET | ORAL | Status: AC
  Administered 2015-10-20 (×3): 40 meq via ORAL
  Filled 2015-10-20 (×3): qty 2

## 2015-10-20 MED ORDER — ATORVASTATIN CALCIUM 40 MG PO TABS
40.0000 mg | ORAL_TABLET | Freq: Every day | ORAL | Status: DC
  Administered 2015-10-20: 40 mg via ORAL
  Filled 2015-10-20: qty 1

## 2015-10-20 MED ORDER — PANTOPRAZOLE SODIUM 40 MG PO TBEC
40.0000 mg | DELAYED_RELEASE_TABLET | Freq: Every day | ORAL | Status: DC
  Administered 2015-10-20 – 2015-10-21 (×2): 40 mg via ORAL
  Filled 2015-10-20 (×2): qty 1

## 2015-10-20 MED ORDER — SIMVASTATIN 40 MG PO TABS: 40.0000 mg | ORAL_TABLET | Freq: Every evening | ORAL | Status: DC

## 2015-10-20 MED ORDER — FUROSEMIDE 10 MG/ML IJ SOLN
80.0000 mg | Freq: Two times a day (BID) | INTRAMUSCULAR | Status: DC
  Administered 2015-10-20 – 2015-10-21 (×2): 80 mg via INTRAVENOUS
  Filled 2015-10-20 (×2): qty 8

## 2015-10-20 MED ORDER — POTASSIUM CHLORIDE CRYS ER 20 MEQ PO TBCR
40.0000 meq | EXTENDED_RELEASE_TABLET | ORAL | Status: AC
  Administered 2015-10-20 (×2): 40 meq via ORAL
  Filled 2015-10-20 (×3): qty 2

## 2015-10-20 NOTE — Progress Notes (Signed)
PROGRESS NOTE  Nicholas SorrowDexter Wolman  ZOX:[id number] DOB: 05/28/1945 DOA: 10/19/2015 PCP: Alois ClicheSohail N Bazel, MD  Brief Narrative:   Nicholas Stanley is a 70 y.o. male with medical history significant of CAD, chronic systolic CHF with EF of 15%, COPD, hyperlipidemia, hypertension, treatment noncompliance per PCP, comes to the emergency department due to recurrence of intermittent nausea and emesis.  Per patient, he has been having this issue for a few years. This last episode started about 2 weeks prior to admission, he initially had some emesis, but since then he has had persistent nausea, occasional epigastric discomfort, weakness and postural dizziness. He denied fever, chills, diarrhea, constipation, melena or hematochezia. [facility] and admission was recommended due to worsening renal function and hypokalemia, but the patient signed out AMA.  He was followed by his PCP today, who recommended to the patient to return to the emergency department.  Workup in the emergency department demonstrated anemia, hypokalemia 2.6 mmol per liter, elevated BNP and worsening renal failure. CXR demonstrated vascular congestion and mild cardiomegaly.    Assessment & Plan:   Principal Problem:   Hypokalemia Active Problems:   CAD, ARTERY BYPASS GRAFT   SYSTOLIC HEART FAILURE, CHRONIC   Anemia   Gout   Hyperlipidemia   Essential hypertension   Tobacco use disorder   AKI (acute kidney injury) (HCC)   Nausea and vomiting   Hypokalemia due to diuretics, marginally improved.  He missed 4-5 days of potassium last week because Rx needed to be renewed by his PCP.   -  Increase to KCl 40mg  q4h x 3 doses and repeat potassium this afternoon -  magnesium level 2.7  SYSTOLIC HEART FAILURE, CHRONIC, EF 15-30%, NYHA III/IV,  has JVP, rales, and lower extremity edema with evidence of volume overload on CXR.   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 Earmon Phoenixooper's note, not a candidate for advanced therapies -  Heart failure consult to assist with management -  F/u ECHO   AKI (acute kidney injury) (HCC) vs. progression of chronic kidney disease stage IV.  Unclear baseline creatinine but likely between 2 and 2.5 based on labs from last year.   -  BUN and creatinine approximately stable despite IVF -  D/c IVF due to peripheral edema and rales on exam  Nausea and vomiting, possibly due to GERD or heart failure.  Does not have  -  Continue antiemetics as needed. -  D/c IVF -  Start PPI  Active Problems:  CAD, previous STEMI.  S/p CABG and mitral valve repair in 2012 Continue aspirin and change to high dose statin. Hold carvedilol due to hypotension.  Anemia of renal disease.  Iron studies, B12, folate, and TSH are wnl Intermittent CBC  Gout, stable Continue allopurinol.  Hyperlipidemia Continue simvastatin 40 mg by mouth daily. Monitor LFTs periodically.   Essential hypertension, hypotension due to heart failure, no signs of sepsis Hold carvedilol    Tobacco use disorder Declined nicotine replacement therapy.  DVT prophylaxis:  lovenox Code Status:  Full code Family Communication:  Patient alone Disposition Plan:  Pending improvement in potassium, tolerating diet, adjustment to diuretics  and heart failure medications with close outpatient follow up with cardiology.     Consultants:   Heart failure team  Procedures:  none  Antimicrobials:   none    Subjective: Feels "a lot" better than yesterday.  Nausea has improved and he feels his energy is better.  Has some shortness of breath and denies significant swelling.    Objective: Filed Vitals:    10/19/15 2205 10/20/15 0530 10/20/15 0907 10/20/15 0959  BP: 91/68 86/61  90/63  Pulse: 66 66  67  Temp: 97.5 F (36.4 C) 98 F (36.7 C)  97.7 F (36.5 C)  TempSrc: Oral Oral  Oral  Resp: 18 18  100  Height: 5\' 9"  (1.753 m)     Weight: 69.128 kg (152 lb 6.4 oz)     SpO2: 100% 98% 98% 97%    Intake/Output Summary (Last 24 hours) at 10/20/15 1512 Last data filed at 10/20/15 1300  Gross per 24 hour  Intake 1713.33 ml  Output    900 ml  Net 813.33 ml   Filed Weights   10/19/15 1714 10/19/15 2205  Weight: 72.576 kg (160 lb) 69.128 kg (152 lb 6.4 oz)    Examination:  General exam:  Thin adult male, No acute distress.  HEENT:  NCAT, MMM, JVP to preauricular area Respiratory system: rales at bilateral bases Cardiovascular system: Regular rate and rhythm, 3/6 systolic murmur without clear Z6/X0.  Warm extremities Gastrointestinal system: Normal active bowel sounds, soft, nondistended, nontender. MSK:  Normal tone and bulk, 1+ pitting bilateral lower extremity edema Neuro:  Grossly moves all extremities but diffusely weak.     Data Reviewed: I have personally reviewed following labs and imaging studies  CBC:  Recent Labs Lab 10/19/15 1718 10/20/15 0450  WBC 7.6 6.6  NEUTROABS  --  4.8  HGB 10.8* 10.8*  HCT 36.0* 36.0*  MCV 92.5 93.8  PLT 144* 138*   Basic Metabolic Panel:  Recent Labs Lab 10/19/15 1718 10/20/15 0450  NA 135 140  K 2.6* 2.8*  CL 95* 99*  CO2 30 29  GLUCOSE 112* 118*  BUN 94* 93*  CREATININE 3.31* 3.21*  CALCIUM 9.2 9.2  MG 2.7*  --    GFR: Estimated Creatinine Clearance: 21.5 mL/min (by C-G formula based on Cr of 3.21). Liver Function Tests:  Recent Labs Lab 10/20/15 0450  AST 12*  ALT 10*  ALKPHOS 100  BILITOT 1.9*  PROT 6.5  ALBUMIN 3.1*   No results for input(s): LIPASE, AMYLASE in the last 168 hours. No results for input(s): AMMONIA in the last 168 hours. Coagulation Profile: No results for input(s): INR, PROTIME in the  last 168 hours. Cardiac Enzymes: No results for input(s): CKTOTAL, CKMB, CKMBINDEX, TROPONINI in the last 168 hours. BNP (last 3 results) No results for input(s): PROBNP in the last 8760 hours. HbA1C: No results for input(s): HGBA1C in the last 72 hours. CBG: No results for input(s): GLUCAP in the last 168 hours. Lipid Profile: No results for input(s): CHOL, HDL, LDLCALC, TRIG, CHOLHDL, LDLDIRECT in the last 72 hours. Thyroid Function Tests: No results for input(s): TSH, T4TOTAL, FREET4, T3FREE, THYROIDAB in the last 72 hours. Anemia Panel:  Recent Labs  10/20/15 0450  VITAMINB12 419  FOLATE 13.9  FERRITIN 48  TIBC 364  IRON 70  RETICCTPCT 3.2*   Urine analysis:    Component Value Date/Time   COLORURINE YELLOW 10/20/2015 0422   APPEARANCEUR CLEAR 10/20/2015 0422   LABSPEC 1.010 10/20/2015 0422   PHURINE  6.0 10/20/2015 0422   GLUCOSEU NEGATIVE 10/20/2015 0422   HGBUR NEGATIVE 10/20/2015 0422   BILIRUBINUR NEGATIVE 10/20/2015 0422   KETONESUR NEGATIVE 10/20/2015 0422   PROTEINUR NEGATIVE 10/20/2015 0422   NITRITE NEGATIVE 10/20/2015 0422   LEUKOCYTESUR NEGATIVE 10/20/2015 0422   Sepsis Labs: (procalcitonin:4,lacticidven:4)  )No results found for this or any previous visit (from the past 240 hour(s)).    Radiology Studies: Dg Chest 2 View  10/19/2015  CLINICAL DATA:  Acute onset of generalized weakness and nausea. Hypokalemia. Initial encounter. EXAM: CHEST  2 VIEW COMPARISON:  Chest radiograph from 01/26/2015 FINDINGS: The lungs are well-aerated. Vascular congestion is noted. is no evidence of focal opacification, pleural effusion or pneumothorax. The heart is mildly enlarged. The patient is status post median sternotomy, with evidence of prior CABG. A valve replacement is noted. No acute osseous abnormalities are seen. IMPRESSION: Vascular congestion and mild cardiomegaly. Lungs remain grossly clear. Electronically Signed   By: Roanna Raider M.D.   On:  10/19/2015 18:27     Scheduled Meds: . allopurinol  200 mg Oral Daily  . aspirin EC  81 mg Oral Daily  . enoxaparin (LOVENOX) injection  30 mg Subcutaneous QHS  . mometasone-formoterol  2 puff Inhalation BID  . potassium chloride  40 mEq Oral Q4H  . simvastatin  40 mg Oral QPM  . sodium chloride flush  3 mL Intravenous Q12H   Continuous Infusions:       Time spent: 30 min    Renae Fickle, MD [facility] Pager [phone]  If 7PM-7AM, please contact night-coverage [email or web address] Password [email or web address] 10/20/2015, 3:12 PM

## 2015-10-20 NOTE — Care Management Note (Signed)
Case Management Note  Patient Details  Name: Nicholas Stanley MRN: [id number] Date of Birth: 11/29/1945  Subjective/Objective:          CM following for progression and d/c planning.           Action/Plan: 10/20/2015 Pt feeling too ill to discuss plans at this time. Will continue to follow.   Expected Discharge Date:                  Expected Discharge Plan:  Home w Home Health Services  In-House Referral:  NA  Discharge planning Services  CM Consult  Post Acute Care Choice:    Choice offered to:  Patient  DME Arranged:    DME Agency:     HH Arranged:    HH Agency:     Status of Service:  In process, will continue to follow  Medicare Important Message Given:    Date Medicare IM Given:    Medicare IM give by:    Date Additional Medicare IM Given:    Additional Medicare Important Message give by:     If discussed at Long Length of Stay Meetings, dates discussed:    Additional Comments:  Starlyn SkeansRoyal, Meryle Pugmire U, RN 10/20/2015, 11:49 AM

## 2015-10-20 NOTE — Consult Note (Signed)
Advanced Heart Failure Team Consult Note  Referring Physician: Dr Malachi BondsShort Primary Physician: Dr Fredrich BirksBazel  Primary Cardiologist:  Dr Excell Seltzerooper   Reason for Consultation: Heart Failure    HPI:   Mr Nicholas Stanley is a 70 year old with a history of CAD, multiple MI's, CABG 2008, MVR, chronic systolic CHF, COPD, hyperlipidemia, hypertension. Current smoker. Last admit 2016 with volume overload. He signed out AMA.   He has been followed by Dr. Excell Seltzerooper. Last seen in 1/17. EF 30% at that time. He was struggling with recurrent volume overload and hypotension. ACE/ARB had to be held due to AKI. Was given metolazone to use prn.  On 10/18/15 he went to [facility] for dizziness and nausea. Due to worsening renal function and hypokalemia hospital admit was recommended however he signed out AMA.   [facility] ED dizziness and nausea. CXR with vascular congestion. Pertinent admission labs include: K 2.6, creatinine 3.3, BNP 549, Hgb 10.8, and Troponin 0.04. Has ongoing increased SOB.   Lives at home alone. Very limited. Can go from room to room but has to go slowly. Has two sons and a daughter. Says he hasn't talked to his daughter in years. Also says he doesn't tell his sons much. Denies CP. + edema and weakness.   Review of Systems: [y] = yes, [ ]  = no   General: Weight gain [ ] ; Weight loss [ ] ; Anorexia [ ] ; Fatigue [ y]; Fever [ ] ; Chills [ ] ; Weakness [ y]  Cardiac: Chest pain/pressure [ ] ; Resting SOB [ ] ; Exertional SOB [ y]; Orthopnea [ ] ; Pedal Edema Cove.Etienne[y ]; Palpitations [ ] ; Syncope [ ] ; Presyncope [ ] ; Paroxysmal nocturnal dyspnea[ ]   Pulmonary: Cough Cove.Etienne[y ]; Wheezing[ ] ; Hemoptysis[ ] ; Sputum [ ] ; Snoring [ ]   GI: Vomiting[ ] ; Dysphagia[ ] ; Melena[ ] ; Hematochezia [ ] ; Heartburn[ ] ; Abdominal pain [ ] ; Constipation [ ] ; Diarrhea [ ] ; BRBPR [ ]   GU: Hematuria[ ] ; Dysuria [ ] ; Nocturia[ ]   Vascular: Pain in legs with walking [ ] ; Pain in feet with lying flat [ ] ; Non-healing sores [ ] ; Stroke [ ] ;  TIA [ ] ; Slurred speech [ ] ;  Neuro: Headaches[ ] ; Vertigo[ ] ; Seizures[ ] ; Paresthesias[ ] ;Blurred vision [ ] ; Diplopia [ ] ; Vision changes [ ]   Ortho/Skin: Arthritis Cove.Etienne[y ]; Joint pain Cove.Etienne[y ]; Muscle pain [ ] ; Joint swelling [ ] ; Back Pain [ ] ; Rash [ ]   Psych: Depression[ ] ; Anxiety[ ]   Heme: Bleeding problems [ ] ; Clotting disorders [ ] ; Anemia [ ]   Endocrine: Diabetes [ ] ; Thyroid dysfunction[ ]   Home Medications Prior to Admission medications   Medication Sig Start Date End Date Taking? Authorizing Provider  allopurinol (ZYLOPRIM) 100 MG tablet Take 200 mg by mouth daily.  10/22/12  Yes Historical Provider, MD  aspirin 81 MG tablet Take 81 mg by mouth daily.     Yes Historical Provider, MD  carvedilol (COREG) 6.25 MG tablet Take 1 tablet (6.25 mg total) by mouth 2 (two) times daily. 10/11/15  Yes Tonny BollmanMichael Cooper, MD  furosemide (LASIX) 80 MG tablet Take 1 tablet (80 mg total) by mouth 2 (two) times daily. 02/02/15  Yes Rhonda G Barrett, PA-C  metolazone (ZAROXOLYN) 2.5 MG tablet Take one tablet by mouth as needed 30 minutes prior to your morning dosage of Furosemide for weight gain 06/01/15  Yes Tonny BollmanMichael Cooper, MD  potassium chloride SA (KLOR-CON M20) 20 MEQ tablet Take 1 tablet (20 mEq total) by mouth 2 (two)  times daily. 08/18/14  Yes Tonny Bollman, MD  simvastatin (ZOCOR) 40 MG tablet take 1 tablet by mouth daily every evening Patient taking differently: Take 40 mg by mouth every evening.  09/18/12  Yes Tonny Bollman, MD  ADVAIR DISKUS 250-50 MCG/DOSE AEPB Take 1 puff by mouth 2 (two) times daily. Reported on 10/19/2015 05/06/15   Historical Provider, MD  Ipratropium-Albuterol (COMBIVENT) 20-100 MCG/ACT AERS respimat Inhale 1 puff into the lungs 4 (four) times daily as needed for wheezing or shortness of breath. Reported on 10/19/2015 03/09/15   Historical Provider, MD    Past Medical History: Past Medical History  Diagnosis Date  . CAD (coronary artery disease) 2008    s/p CABG  .  HYPERLIPIDEMIA-MIXED   . RENAL ARTERY STENOSIS   . Old MI (myocardial infarction) 2008  . Ischemic cardiomyopathy     a. has refused CRT-D in past;  b. echo 4/16: EF 25-30%, diffuse HK, no aortic stenosis, s/p MV repair with residual mild MR, mild LAE, moderate RAE, mild RVE, moderate TR, PASP 55  . COPD (chronic obstructive pulmonary disease) (HCC)     Past Surgical History: Past Surgical History  Procedure Laterality Date  . Coronary artery bypass graft  2008    LIMA-LAD, SVG-D1, SVG-OM1, SVG-PDA, MV repair w/ 30 mm Physio annuloplasty ring  . Mitral valve annuloplasty  2008    30 mm Physio ring    Family History: Family History  Problem Relation Age of Onset  . Coronary artery disease Other   . Stroke Mother   . Heart attack Father   . Diabetes Brother   . Cancer Paternal Grandfather   . Hypertension Father     Social History: Social History   Social History  . Marital Status: Single    Spouse Name: N/A  . Number of Children: N/A  . Years of Education: N/A   Social History Main Topics  . Smoking status: Former Games developer  . Smokeless tobacco: None     Comment: PATIENT HAD NOT SMOKE FOR TWO MONTHS  . Alcohol Use: No  . Drug Use: No  . Sexual Activity: No   Other Topics Concern  . None   Social History Narrative    Allergies:  No Known Allergies  Objective:    Vital Signs:   Temp:  [97.5 F (36.4 C)-98 F (36.7 C)] 97.7 F (36.5 C) (06/01 0959) Pulse Rate:  [62-67] 67 (06/01 0959) Resp:  [13-100] 100 (06/01 0959) BP: (86-95)/(51-74) 90/63 mmHg (06/01 0959) SpO2:  [97 %-100 %] 97 % (06/01 0959) Weight:  [152 lb 6.4 oz (69.128 kg)-160 lb (72.576 kg)] 152 lb 6.4 oz (69.128 kg) (05/31 2205) Last BM Date: 10/18/15  Weight change: Filed Weights   10/19/15 1714 10/19/15 2205  Weight: 160 lb (72.576 kg) 152 lb 6.4 oz (69.128 kg)    Intake/Output:   Intake/Output Summary (Last 24 hours) at 10/20/15 1546 Last data filed at 10/20/15 1300  Gross per 24  hour  Intake 1713.33 ml  Output    900 ml  Net 813.33 ml     Physical Exam: General:  Elderly. Chronically ill appearing. No resp difficulty. Sitting on the side of the bed.  HEENT: normal except for poor dentition  Neck: supple. JVP to jaw.  Carotids 2+ bilat; no bruits. No lymphadenopathy or thryomegaly appreciated. Cor: PMI nondisplaced. Regular rate & rhythm. No rubs, or murmurs. + S3  Lungs: clear Abdomen: soft, nontender, nondistended. + large hernia. No bruits or masses. Good bowel  sounds. Extremities: no cyanosis, clubbing, rash, R and LLE 1+ edema cool  Neuro: alert & orientedx3, cranial nerves grossly intact. moves all 4 extremities w/o difficulty. Affect pleasant  Telemetry: NSR  Labs: Basic Metabolic Panel:  Recent Labs Lab 10/19/15 1718 10/20/15 0450  NA 135 140  K 2.6* 2.8*  CL 95* 99*  CO2 30 29  GLUCOSE 112* 118*  BUN 94* 93*  CREATININE 3.31* 3.21*  CALCIUM 9.2 9.2  MG 2.7*  --     Liver Function Tests:  Recent Labs Lab 10/20/15 0450  AST 12*  ALT 10*  ALKPHOS 100  BILITOT 1.9*  PROT 6.5  ALBUMIN 3.1*   No results for input(s): LIPASE, AMYLASE in the last 168 hours. No results for input(s): AMMONIA in the last 168 hours.  CBC:  Recent Labs Lab 10/19/15 1718 10/20/15 0450  WBC 7.6 6.6  NEUTROABS  --  4.8  HGB 10.8* 10.8*  HCT 36.0* 36.0*  MCV 92.5 93.8  PLT 144* 138*    Cardiac Enzymes: No results for input(s): CKTOTAL, CKMB, CKMBINDEX, TROPONINI in the last 168 hours.  BNP: BNP (last 3 results)  Recent Labs  01/26/15 1644 10/19/15 1718  BNP 884.1* 549.0*    ProBNP (last 3 results) No results for input(s): PROBNP in the last 8760 hours.   CBG: No results for input(s): GLUCAP in the last 168 hours.  Coagulation Studies: No results for input(s): LABPROT, INR in the last 72 hours.  Other results: EKG: NSR 67 bpm  LBBB with frequent PVC (QRS ) Imaging: Dg Chest 2 View  10/19/2015  CLINICAL DATA:  Acute onset  of generalized weakness and nausea. Hypokalemia. Initial encounter. EXAM: CHEST  2 VIEW COMPARISON:  Chest radiograph from 01/26/2015 FINDINGS: The lungs are well-aerated. Vascular congestion is noted. is no evidence of focal opacification, pleural effusion or pneumothorax. The heart is mildly enlarged. The patient is status post median sternotomy, with evidence of prior CABG. A valve replacement is noted. No acute osseous abnormalities are seen. IMPRESSION: Vascular congestion and mild cardiomegaly. Lungs remain grossly clear. Electronically Signed   By: Roanna Raider M.D.   On: 10/19/2015 18:27      Medications:     Current Medications: . allopurinol  200 mg Oral Daily  . aspirin EC  81 mg Oral Daily  . atorvastatin  40 mg Oral q1800  . enoxaparin (LOVENOX) injection  30 mg Subcutaneous QHS  . mometasone-formoterol  2 puff Inhalation BID  . pantoprazole  40 mg Oral Daily  . potassium chloride  40 mEq Oral Q4H  . sodium chloride flush  3 mL Intravenous Q12H     Infusions:      Assessment:   1. A/c systolic HF - with likely low output    --EF 20% with moderate to severe RV dysfunction on echo today 2. A/C Renal Failure 3. LBBB 4. COPD with ongoing tobacco use  5. CAD s/p CABG 6. Noncompliance    Plan/Discussion:    Mr Shabazz is a 70 year old admitted with nausea and increased dyspnea likely due to low output heart failure. Todays ECHO was reviewed by Dr Gala Romney and his EF is much worse.   Length of Stay:   Tonye Becket NP-C  10/20/2015, 3:46 PM  Advanced Heart Failure Team Pager [phone] (M-F; 7a - 4p)  Please contact [facility] for night-coverage after hours (4p -7a ) and weekends on [email or web address]  Patient seen and examined with Tonye Becket, NP. We discussed all aspects  of the encounter. I agree with the assessment and plan as stated above.   I have reviewed his echo images personally and also reviewed previous notes. He has end-stage systolic HF now with NYHA IV  symptoms suggestive of low output. He is markedly volume overloaded on exam. He has refused aggressive treatment in the past and he tells me he is going home in the am as well.   We talked about end of life issues and he wants to be DNR but does not want Hospice at this point. We will diurese with IV lasix and metolazone and suspect he will sign out [facility] tomorrow. I think he would benefit from switching to torsemide on d/c. The HF team will do our best to engage with him and manage him as best as we can as an outpatient.   Code status changed in chart. Discussed with Dr. Malachi Bonds.   Bensimhon, Daniel,MD 7:23 PM

## 2015-10-20 NOTE — ED Provider Notes (Signed)
I saw and evaluated the patient, reviewed the resident's note and I agree with the findings and plan.   EKG Interpretation   Date/Time:  Wednesday Oct 19 2015 17:13:48 EDT Ventricular Rate:  67 PR Interval:  184 QRS Duration: 188 QT Interval:  508 QTC Calculation: 536 R Axis:   -44 Text Interpretation:  Sinus rhythm with Premature atrial complexes with  Abberant conduction Left axis deviation Left bundle branch block Abnormal  ECG known LBBB, with PVCs but similar to prior Confirmed by Katelinn Justice MD, Britne Borelli  [id number]) on 10/19/2015 5:23:7537 PM      70 year old male with history of CAD and ICMP with EF 25-30% who presents with nausea and generalized weakness. Difficult historian but states feeling weak and unwell for quite some time. With intermittent nausea and vomiting, lightheadedness. Seen at [facility] ED and had hypokalemia. Recommended admission but he left AMA. Seen by PCP today and ask to go to ED. Denies chest pain, sob, LE edema, orthopnea, PND, HA, vision or speech changes, focal numbness/weakness, abdominal pain, diarrhea, F/C. On arrival, chronically ill appearing. Appears dry and son states he has been losing weight. Low BP 90s/60s, but this appears to be close to baseline BP on chart review. mentating normally. With hypokalemia to 2.6 and will be given repletion. With gently rehydrate. Discussed with Dr. Robb Matarortiz who will admit for ongoing care.  Lavera Guiseana Duo Aleene Swanner, MD 10/20/15 [phone]

## 2015-10-20 NOTE — Progress Notes (Signed)
  Echocardiogram 2D Echocardiogram has been performed.  Arvil ChacoFoster, Uzoma Vivona 10/20/2015, 4:16 PM

## 2015-10-20 NOTE — Care Management Obs Status (Signed)
MEDICARE OBSERVATION STATUS NOTIFICATION   Patient Details  Name: Nicholas Stanley MRN: [id number] Date of Birth: 12/26/1945   Medicare Observation Status Notification Given:  Yes Pt too ill to listen to explanation or sign paper.    Jermanie Minshall, Annamarie Majorheryl U, RN 10/20/2015, 11:02 AM

## 2015-10-21 DIAGNOSIS — F17200 Nicotine dependence, unspecified, uncomplicated: Secondary | ICD-10-CM

## 2015-10-21 DIAGNOSIS — R112 Nausea with vomiting, unspecified: Secondary | ICD-10-CM | POA: Diagnosis not present

## 2015-10-21 DIAGNOSIS — E876 Hypokalemia: Secondary | ICD-10-CM | POA: Diagnosis not present

## 2015-10-21 DIAGNOSIS — I5023 Acute on chronic systolic (congestive) heart failure: Secondary | ICD-10-CM | POA: Insufficient documentation

## 2015-10-21 DIAGNOSIS — N179 Acute kidney failure, unspecified: Secondary | ICD-10-CM | POA: Diagnosis not present

## 2015-10-21 LAB — BASIC METABOLIC PANEL
ANION GAP: 8 (ref 5–15)
BUN: 89 mg/dL — ABNORMAL HIGH (ref 6–20)
CHLORIDE: 105 mmol/L (ref 101–111)
CO2: 26 mmol/L (ref 22–32)
Calcium: 9.1 mg/dL (ref 8.9–10.3)
Creatinine, Ser: 2.82 mg/dL — ABNORMAL HIGH (ref 0.61–1.24)
GFR calc non Af Amer: 21 mL/min — ABNORMAL LOW (ref >60)
GFR, EST AFRICAN AMERICAN: 25 mL/min — AB (ref >60)
Glucose, Bld: 103 mg/dL — ABNORMAL HIGH (ref 65–99)
POTASSIUM: 4.4 mmol/L (ref 3.5–5.1)
SODIUM: 139 mmol/L (ref 135–145)

## 2015-10-21 MED ORDER — OMEPRAZOLE 20 MG PO CPDR: 20.0000 mg | DELAYED_RELEASE_CAPSULE | Freq: Every day | ORAL | Status: AC

## 2015-10-21 MED ORDER — ALPRAZOLAM 0.25 MG PO TABS
0.2500 mg | ORAL_TABLET | Freq: Once | ORAL | Status: AC
  Administered 2015-10-21: 0.25 mg via ORAL
  Filled 2015-10-21: qty 1

## 2015-10-21 MED ORDER — TORSEMIDE 20 MG PO TABS: 40.0000 mg | ORAL_TABLET | Freq: Every day | ORAL | Status: DC

## 2015-10-21 NOTE — Progress Notes (Signed)
Advanced Heart Failure Rounding Note   Subjective:    Yesterday diuresed with IV lasix + metolazone.   Wants to go home     Objective:   Weight Range:  Vital Signs:   Temp:  [97.4 F (36.3 C)-98.4 F (36.9 C)] 97.5 F (36.4 C) (06/02 0843) Pulse Rate:  [65-75] 72 (06/02 0843) Resp:  [18-20] 18 (06/02 0843) BP: (88-107)/(57-72) 92/66 mmHg (06/02 0843) SpO2:  [93 %-100 %] 94 % (06/02 0843) Weight:  [165 lb 12.8 oz (75.206 kg)] 165 lb 12.8 oz (75.206 kg) (06/01 2007) Last BM Date: 10/18/15  Weight change: Filed Weights   10/19/15 1714 10/19/15 2205 10/20/15 2007  Weight: 160 lb (72.576 kg) 152 lb 6.4 oz (69.128 kg) 165 lb 12.8 oz (75.206 kg)    Intake/Output:   Intake/Output Summary (Last 24 hours) at 10/21/15 0939 Last data filed at 10/21/15 0844  Gross per 24 hour  Intake    720 ml  Output   1725 ml  Net  -1005 ml    Physical Exam: General: Elderly. Chronically ill appearing. No resp difficulty. Sitting on the side of the bed.  HEENT: normal except for poor dentition  Neck: supple. JVP to jaw. Carotids 2+ bilat; no bruits. No lymphadenopathy or thryomegaly appreciated. Cor: PMI nondisplaced. Regular rate & rhythm. No rubs, or murmurs. + S3  Lungs: clear Abdomen: soft, nontender, nondistended. + large hernia. No bruits or masses. Good bowel sounds. Extremities: no cyanosis, clubbing, rash, R and LLE 1+ edema cool  Neuro: alert & orientedx3, cranial nerves grossly intact. moves all 4 extremities w/o difficulty. Affect pleasant  Telemetry: NSR Labs: Basic Metabolic Panel:  Recent Labs Lab 10/19/15 1718 10/20/15 0450 10/20/15 1626 10/21/15 0418  NA 135 140 139 139  K 2.6* 2.8* 3.4* 4.4  CL 95* 99* 101 105  CO2 30 29 28 26   GLUCOSE 112* 118* 85 103*  BUN 94* 93* 91* 89*  CREATININE 3.31* 3.21* 2.95* 2.82*  CALCIUM 9.2 9.2 9.3 9.1  MG 2.7*  --   --   --     Liver Function Tests:  Recent Labs Lab 10/20/15 0450  AST 12*  ALT 10*    ALKPHOS 100  BILITOT 1.9*  PROT 6.5  ALBUMIN 3.1*   No results for input(s): LIPASE, AMYLASE in the last 168 hours. No results for input(s): AMMONIA in the last 168 hours.  CBC:  Recent Labs Lab 10/19/15 1718 10/20/15 0450  WBC 7.6 6.6  NEUTROABS  --  4.8  HGB 10.8* 10.8*  HCT 36.0* 36.0*  MCV 92.5 93.8  PLT 144* 138*    Cardiac Enzymes: No results for input(s): CKTOTAL, CKMB, CKMBINDEX, TROPONINI in the last 168 hours.  BNP: BNP (last 3 results)  Recent Labs  01/26/15 1644 10/19/15 1718  BNP 884.1* 549.0*    ProBNP (last 3 results) No results for input(s): PROBNP in the last 8760 hours.    Other results:  Imaging: Dg Chest 2 View  10/19/2015  CLINICAL DATA:  Acute onset of generalized weakness and nausea. Hypokalemia. Initial encounter. EXAM: CHEST  2 VIEW COMPARISON:  Chest radiograph from 01/26/2015 FINDINGS: The lungs are well-aerated. Vascular congestion is noted. is no evidence of focal opacification, pleural effusion or pneumothorax. The heart is mildly enlarged. The patient is status post median sternotomy, with evidence of prior CABG. A valve replacement is noted. No acute osseous abnormalities are seen. IMPRESSION: Vascular congestion and mild cardiomegaly. Lungs remain grossly clear. Electronically Signed  By: Roanna Raider M.D.   On: 10/19/2015 18:27      Medications:     Scheduled Medications: . allopurinol  200 mg Oral Daily  . aspirin EC  81 mg Oral Daily  . atorvastatin  40 mg Oral q1800  . enoxaparin (LOVENOX) injection  30 mg Subcutaneous QHS  . furosemide  80 mg Intravenous BID  . metolazone  2.5 mg Oral Daily  . mometasone-formoterol  2 puff Inhalation BID  . pantoprazole  40 mg Oral Daily  . sodium chloride flush  3 mL Intravenous Q12H     Infusions:     PRN Medications:  ipratropium-albuterol, ondansetron **OR** ondansetron (ZOFRAN) IV   Assessment:  1. A/c systolic HF - with likely low output  --EF 20% with  moderate to severe RV dysfunction on echo today 2. A/C Renal Failure 3. LBBB 4. COPD with ongoing tobacco use  5. CAD s/p CABG 6. Noncompliance 7. DNR     Plan/Discussion:     Remains hypotensive. Diuresed a little but demanding to go home. Has had dose of IV lasix today. Would switch to torsemide 40 mg bid. No bb or ace with hypotension and renal failure.   Offered follow up in the HF clinic however he declines. Offered Palliative Care referral however he declines.   Little to offer.   Length of Stay:    Tonye Becket NP-C  10/21/2015, 9:39 AM  Advanced Heart Failure Team Pager [phone] (M-F; 7a - 4p)  Please contact [facility] for night-coverage after hours (4p -7a ) and weekends on [email or web address]  He has diuresed modestly. Weight inaccurate. Neck veins still up but he refuses to stay another day. If he stays would continue IV lasix 80 bid and metolazone. If he goes home would switch lasix to demadex 40 bid.   D/c heart meds  Stop carvedilol due to low ouput Demadex 40 bid.  Hydralazine 12.5 tid Imdur 30 daily.  kcl 20 bid simva 40 daily ecasa 81 Metolazone prn  He refuses HF clinic follow up.   Bensimhon, Daniel,MD 1:49 PM

## 2015-10-21 NOTE — Discharge Summary (Signed)
Physician Discharge Summary  Nicholas Stanley BXU:[id number] DOB: [birth year] DOA: 10/19/2015  PCP: Beverlyn Roux, MD  Admit date: 10/19/2015 Discharge date: 10/21/2015  Recommendations for Outpatient Follow-up:  1. Follow up with Cardiology in 1-2 weeks for repeat BMP to check kidney function and weight check 2. Recommended home hospice and provided information, however, patient not ready for enrollment at this time 3. Changed furosemide to torsemide and stopped carvedilol during this admission  Discharge Diagnoses:  Principal Problem:   Hypokalemia Active Problems:   CAD, ARTERY BYPASS GRAFT   SYSTOLIC HEART FAILURE, CHRONIC   Anemia   Gout   Hyperlipidemia   Essential hypertension   Tobacco use disorder   AKI (acute kidney injury) ([place])   Nausea and vomiting   Acute on chronic systolic heart failure ([place])   Discharge Condition: fair  Diet recommendation: low sodium  Wt Readings from Last 3 Encounters:  10/20/15 75.206 kg (165 lb 12.8 oz)  06/01/15 75.206 kg (165 lb 12.8 oz)  02/02/15 74.844 kg (165 lb)    History of present illness:  Nicholas Stanley is a 70 y.o. male with medical history significant of CAD, chronic systolic CHF with EF of 19%, COPD, hyperlipidemia, hypertension, treatment noncompliance per PCP, comes to the emergency department due to recurrence of intermittent nausea and emesis. Per patient, he has been having this issue for a few years. This last episode started about 2 weeks prior to admission, he initially had some emesis, but since then he has had persistent nausea, occasional epigastric discomfort, weakness and postural dizziness. He denied fever, chills, diarrhea, constipation, melena or hematochezia. He went to the [facility] emergency department the night before coming to [facility] and admission was recommended due to worsening renal function and hypokalemia, but the patient signed out AMA. He was followed by his PCP today, who recommended to the patient to return  to the emergency department. Workup in the emergency department demonstrated anemia, hypokalemia 2.6 mmol per liter, elevated BNP and worsening renal failure. CXR demonstrated vascular congestion and mild cardiomegaly.   Hospital Course:   Hypokalemia due to diuretics. Also, he missed 4-5 days of potassium the week prior to his admission because Rx needed to be renewed by his PCP. Resolved with aggressive potassium supplementation.  His magnesium level was 2.7.  Repeat BMP in 1-2 weeks.  SYSTOLIC HEART FAILURE, CHRONIC, EF 15-30%, NYHA III/IV.  He has had progressive dyspnea, weakness, anorexia, fatigue and some weight loss consistent with worsening heart failure.  On exam, he had elevated JVP, rales, and lower extremity edema.  He also had evidence of volume overload on CXR.  His BNP was 549. He was seen by Dr. Haroldine Laws from the heart failure team who recommended diuresis with IV lasix and he gave a dose of metolazone.  Mr. Worrel diuresed a little over a liter and felt somewhat better.  He asked to go home.  His carvedilol was discontinued due to hypotension.  Not a good candidate for Imdur/hydralazine for same reason.  Not a candidate for spironolactone/ACEI/ARB due to CKD.  His lasix was changed to torsemide 73m BID and he was advised to continue to use metolazone as needed for excessive weight gain.  He declined follow up with the heart failure team and was advised to see his PCP or Cardiologist in 1-2 weeks for repeat blood work and exam.  He has minimal family involvement and lives alone.  He has limited insight into his medical conditions and medications and does not like doctors or  hospitals.  He met with the pharmacist to help clarify his medications.  Code status was discussed with the heart failure team and he elected to become DNR/DNI but was unsure about enrolling in hospice care at this time.  The case manager provided him with information about hospice services in his area.     Acute  on chronic kidney disease stage IV. Unclear baseline creatinine but likely between 2 and 2.5 based on labs from last year. Creatinine trended down from 3.31 to 2.82 with diuresis, so likely had some cardiorenal syndrome.    Nausea and vomiting, possibly due to GERD or heart failure. Given antiemetics and diuresed.  Recommended a trial of omeprazole for one month.    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  Continued aspirin and patient requested to stay on his statin.  Stopped carvedilol due to hypotension.  Anemia of renal disease. Iron studies, B12, folate, and TSH are wnl.  Further monitoring by PCP.    Gout, stable.  Continue allopurinol.  Hyperlipidemia, continued simvastatin 40 mg by mouth daily, but could consider stopping this medication given his progressive heart failure.  Essential hypertension, hypotension due to heart failure, no signs of sepsis.  Stopped carvedilol   Tobacco use disorder.  Declined nicotine replacement therapy.  Procedures:  None  Consultations:  Dr. Haroldine Laws, Heart Failure team  Discharge Exam: Filed Vitals:   10/21/15 0528 10/21/15 0843  BP: 107/65 92/66  Pulse: 75 72  Temp: 97.5 F (36.4 C) 97.5 F (36.4 C)  Resp: 20 18   Filed Vitals:   10/20/15 2004 10/20/15 2007 10/21/15 0528 10/21/15 0843  BP:  100/72 107/65 92/66  Pulse: 65 71 75 72  Temp:  97.4 F (36.3 C) 97.5 F (36.4 C) 97.5 F (36.4 C)  TempSrc:  Oral Oral Oral  Resp: _0 Height:      Weight:  75.206 kg (165 lb 12.8 oz)    SpO2: 96% 100% 93% 94%    General exam: Thin adult male, No acute distress.  HEENT: NCAT, MMM Respiratory system: rales at bilateral bases Cardiovascular system: Regular rate and rhythm, 3/6 systolic murmur without clear S1/S2. Warm extremities Gastrointestinal system: Normal active bowel sounds, soft, nondistended, nontender. MSK: Normal tone and bulk, trace pitting bilateral lower extremity edema Neuro:  Grossly moves all extremities but diffusely weak.   Discharge Instructions      Discharge Instructions    (HEART FAILURE PATIENTS) Call MD:  Anytime you have any of the following symptoms: 1) 3 pound weight gain in 24 hours or 5 pounds in 1 week 2) shortness of breath, with or without a dry hacking cough 3) swelling in the hands, feet or stomach 4) if you have to sleep on extra pillows at night in order to breathe.    Complete by:  As directed      Call MD for:  difficulty breathing, headache or visual disturbances    Complete by:  As directed      Call MD for:  extreme fatigue    Complete by:  As directed      Call MD for:  hives    Complete by:  As directed      Call MD for:  persistant dizziness or light-headedness    Complete by:  As directed      Call MD for:  persistant nausea and vomiting    Complete by:  As directed      Call  MD for:  severe uncontrolled pain    Complete by:  As directed      Call MD for:  temperature >100.4    Complete by:  As directed      Diet - low sodium heart healthy    Complete by:  As directed      Discharge instructions    Complete by:  As directed   Please stop your carvedilol because your blood pressures are low.  Please STOP your furosemide (Lasix).  START torsemide, a diuretic which helps get rid of extra fluid.  You will need to have your potassium level rechecked in about a week, which can be done by Dr. Antionette Char office or by your primary care doctor.  Please weigh yourself daily and if you gain more than 5-lbs from the time you are discharged from the hospital, take your metolazone and call Dr. Antionette Char office for further instructions.     Increase activity slowly    Complete by:  As directed             Medication List    STOP taking these medications        carvedilol 6.25 MG tablet  Commonly known as:  COREG     furosemide 80 MG tablet  Commonly known as:  LASIX      TAKE these medications        ADVAIR DISKUS 250-50 MCG/DOSE  Aepb  Generic drug:  Fluticasone-Salmeterol  Take 1 puff by mouth 2 (two) times daily. Reported on 10/19/2015     allopurinol 100 MG tablet  Commonly known as:  ZYLOPRIM  Take 200 mg by mouth daily.     aspirin 81 MG tablet  Take 81 mg by mouth daily.     Ipratropium-Albuterol 20-100 MCG/ACT Aers respimat  Commonly known as:  COMBIVENT  Inhale 1 puff into the lungs 4 (four) times daily as needed for wheezing or shortness of breath. Reported on 10/19/2015     metolazone 2.5 MG tablet  Commonly known as:  ZAROXOLYN  Take one tablet by mouth as needed 30 minutes prior to your morning dosage of Furosemide for weight gain     omeprazole 20 MG capsule  Commonly known as:  PRILOSEC  Take 1 capsule (20 mg total) by mouth daily.     potassium chloride SA 20 MEQ tablet  Commonly known as:  KLOR-CON M20  Take 1 tablet (20 mEq total) by mouth 2 (two) times daily.     simvastatin 40 MG tablet  Commonly known as:  ZOCOR  take 1 tablet by mouth daily every evening     torsemide 20 MG tablet  Commonly known as:  DEMADEX  Take 2 tablets (40 mg total) by mouth daily.       Follow-up Information    Please follow up.   Why:  For care at home, call [facility]  [place]   address: [address], [place], [place] [address]      Follow up with Sherren Mocha, MD In 2 weeks.   Specialty:  Cardiology   Contact information:   [address] [place] [address] [phone]       Follow up with Beverlyn Roux, MD. Schedule an appointment as soon as possible for a visit in 1 week.   Specialty:  Internal Medicine   Contact information:   [place] [address] [phone]        The results  of significant diagnostics from this hospitalization (including imaging, microbiology, ancillary and laboratory) are listed below for reference.    Significant Diagnostic Studies: Dg Chest 2 View  10/19/2015  CLINICAL DATA:  Acute onset of  generalized weakness and nausea. Hypokalemia. Initial encounter. EXAM: CHEST  2 VIEW COMPARISON:  Chest radiograph from 01/26/2015 FINDINGS: The lungs are well-aerated. Vascular congestion is noted. is no evidence of focal opacification, pleural effusion or pneumothorax. The heart is mildly enlarged. The patient is status post median sternotomy, with evidence of prior CABG. A valve replacement is noted. No acute osseous abnormalities are seen. IMPRESSION: Vascular congestion and mild cardiomegaly. Lungs remain grossly clear. Electronically Signed   By: Garald Balding M.D.   On: 10/19/2015 18:27    Microbiology: No results found for this or any previous visit (from the past 240 hour(s)).   Labs: Basic Metabolic Panel:  Recent Labs Lab 10/19/15 1718 10/20/15 0450 10/20/15 1626 10/21/15 0418  NA 135 140 139 139  K 2.6* 2.8* 3.4* 4.4  CL 95* 99* 101 105  CO2 _0 GLUCOSE 112* 118* 85 103*  BUN 94* 93* 91* 89*  CREATININE 3.31* 3.21* 2.95* 2.82*  CALCIUM 9.2 9.2 9.3 9.1  MG 2.7*  --   --   --    Liver Function Tests:  Recent Labs Lab 10/20/15 0450  AST 12*  ALT 10*  ALKPHOS 100  BILITOT 1.9*  PROT 6.5  ALBUMIN 3.1*   No results for input(s): LIPASE, AMYLASE in the last 168 hours. No results for input(s): AMMONIA in the last 168 hours. CBC:  Recent Labs Lab 10/19/15 1718 10/20/15 0450  WBC 7.6 6.6  NEUTROABS  --  4.8  HGB 10.8* 10.8*  HCT 36.0* 36.0*  MCV 92.5 93.8  PLT 144* 138*   Cardiac Enzymes: No results for input(s): CKTOTAL, CKMB, CKMBINDEX, TROPONINI in the last 168 hours. BNP: BNP (last 3 results)  Recent Labs  01/26/15 1644 10/19/15 1718  BNP 884.1* 549.0*    ProBNP (last 3 results) No results for input(s): PROBNP in the last 8760 hours.  CBG: No results for input(s): GLUCAP in the last 168 hours.  Time coordinating discharge: 35 minutes  Signed:  Dartha Rozzell  [facility] 10/21/2015, 2:40 PM

## 2015-10-21 NOTE — Evaluation (Signed)
Physical Therapy Evaluation Patient Details Name: Nicholas Stanley MRN: [id number] DOB: 1945-08-30 Today's Date: 10/21/2015   History of Present Illness  Nicholas Stanley is a 70 y.o. male with medical history significant of CAD, chronic systolic CHF, COPD, hyperlipidemia, hypertension, treatment noncompliance per PCP, comes to the emergency department due to recurrence of intermittent nausea and emesis.   Clinical Impression  Pt admitted with above diagnosis. Pt currently with functional limitations due to the deficits listed below (see PT Problem List). Pt will benefit from skilled PT to increase their independence and safety with mobility to allow discharge to the venue listed below.  Pt fatigued with gait with min/guard, but did not want to use AD, although he does have RW at home.  Will follow acutely, but pt not open to PT at home. States his son lives next door and can help some.     Follow Up Recommendations No PT follow up    Equipment Recommendations  None recommended by PT    Recommendations for Other Services       Precautions / Restrictions Precautions Precautions: Fall Restrictions Weight Bearing Restrictions: No      Mobility  Bed Mobility Overal bed mobility: Modified Independent             General bed mobility comments: unable to don socks sitting EOB  Transfers Overall transfer level: Needs assistance   Transfers: Sit to/from Stand Sit to Stand: Supervision            Ambulation/Gait Ambulation/Gait assistance: Min guard Ambulation Distance (Feet): 100 Feet Assistive device: None Gait Pattern/deviations: Decreased step length - left;Decreased stance time - right;Drifts right/left     General Gait Details: Pt took 2 standing rest breaks and used rail 50% of the time.  Offered RW, but states "I'm not an invalid", although he does state he uses one at home.  Stairs            Wheelchair Mobility    Modified Rankin (Stroke Patients Only)       Balance Overall balance assessment: History of Falls                                           Pertinent Vitals/Pain Pain Assessment: No/denies pain    Home Living Family/patient expects to be discharged to:: Private residence Living Arrangements: Alone Available Help at Discharge: Family;Available PRN/intermittently Type of Home: House Home Access: Stairs to enter Entrance Stairs-Rails: Doctor, general practice of Steps: 3 Home Layout: One level Home Equipment: Cane - single point;Walker - 2 wheels;Wheelchair - manual      Prior Function Level of Independence: Independent with assistive device(s)         Comments: Occasionally uses walker or the cane     Hand Dominance        Extremity/Trunk Assessment   Upper Extremity Assessment: Overall WFL for tasks assessed           Lower Extremity Assessment: Overall WFL for tasks assessed      Cervical / Trunk Assessment: Normal  Communication   Communication: No difficulties  Cognition Arousal/Alertness: Awake/alert Behavior During Therapy: WFL for tasks assessed/performed Overall Cognitive Status: Within Functional Limits for tasks assessed                      General Comments General comments (skin integrity, edema, etc.): Pt agreeable to PT,  but somewhat resistant to suggestions and answering questions.    Exercises        Assessment/Plan    PT Assessment Patient needs continued PT services  PT Diagnosis Difficulty walking   PT Problem List Decreased activity tolerance;Decreased balance;Decreased mobility;Decreased knowledge of use of DME  PT Treatment Interventions Gait training;Stair training;Functional mobility training;Therapeutic activities;Therapeutic exercise;Balance training   PT Goals (Current goals can be found in the Care Plan section) Acute Rehab PT Goals Patient Stated Goal: to go home PT Goal Formulation: With patient Time For Goal Achievement:  10/26/15 Potential to Achieve Goals: Good    Frequency Min 3X/week   Barriers to discharge        Co-evaluation               End of Session Equipment Utilized During Treatment: Gait belt Activity Tolerance: Patient limited by fatigue Patient left: in bed;with call bell/phone within reach;with bed alarm set;Other (comment) (sitting EOB) Nurse Communication: Mobility status (Nurse Tech)    Functional Assessment Tool Used: clinical judgement and objective findings Functional Limitation: Mobility: Walking and moving around Mobility: Walking and Moving Around Current Status ([id number](G8978): At least 1 percent but less than 20 percent impaired, limited or restricted Mobility: Walking and Moving Around Goal Status [phone](G8979): 0 percent impaired, limited or restricted    Time: 1024-1040 PT Time Calculation (min) (ACUTE ONLY): 16 min   Charges:   PT Evaluation $PT Eval Low Complexity: 1 Procedure     PT G Codes:   PT G-Codes **NOT FOR INPATIENT CLASS** Functional Assessment Tool Used: clinical judgement and objective findings Functional Limitation: Mobility: Walking and moving around Mobility: Walking and Moving Around Current Status ([id number](G8978): At least 1 percent but less than 20 percent impaired, limited or restricted Mobility: Walking and Moving Around Goal Status [phone](G8979): 0 percent impaired, limited or restricted    Nicholas Stanley 10/21/2015, 11:28 AM

## 2015-10-21 NOTE — Progress Notes (Signed)
Pt crying sitting on side of bed.  Pt states he does not want to go to sleep because he might die in his sleep.  Tried to give information to pt about palliative and hospice options.  Pt upset about not being close to his children, doesn't like to ask for help... Called MD and got a xanax to help pt rest.  Nicholas Stanley, Nicholas Stanley

## 2015-10-21 NOTE — Care Management Note (Addendum)
Case Management Note  Patient Details  Name: Nicholas Stanley MRN: [id number] Date of Birth: Jul 20, 1945  Subjective/Objective:      CM following for progression and d/c planning.              Action/Plan: 10/21/2015 Met with pt to discuss d/c needs. Per pt he does not wish to have [place] services , declining [place] and HHPT . Pt states that he has no needs and that his youngest son lives across the road from him and can assist if needed. Pt states that he has been driving however if he is unable to drive his son will pick up groceries or meds, etc. Per pt he uses walker at home.  01:45 pm Per MD request, info placed in D/C instructions re [facility] , phone number for pt to call after he has time to consider his needs and if he decides that he would benefit from these services.   Expected Discharge Date:   10/21/2015               Expected Discharge Plan:  Home/Self Care  In-House Referral:  NA  Discharge planning Services  CM Consult  Post Acute Care Choice:    Choice offered to:  Patient  DME Arranged:   Declined DME Agency:   Declined  HH Arranged:   Declined HH Agency:   Declined.   Status of Service:  Completed, signed off  Medicare Important Message Given:    Date Medicare IM Given:    Medicare IM give by:    Date Additional Medicare IM Given:    Additional Medicare Important Message give by:     If discussed at [place] of Stay Meetings, dates discussed:    Additional Comments:  Adron Bene, RN 10/21/2015, 11:06 AM

## 2015-10-25 ENCOUNTER — Ambulatory Visit (INDEPENDENT_AMBULATORY_CARE_PROVIDER_SITE_OTHER): Payer: Medicare Other | Admitting: Cardiovascular Disease

## 2015-10-25 ENCOUNTER — Encounter: Payer: Self-pay | Admitting: Cardiovascular Disease

## 2015-10-25 VITALS — BP 104/68 | HR 76 | Ht 69.0 in | Wt 157.2 lb

## 2015-10-25 DIAGNOSIS — I5043 Acute on chronic combined systolic (congestive) and diastolic (congestive) heart failure: Secondary | ICD-10-CM | POA: Diagnosis not present

## 2015-10-25 NOTE — Progress Notes (Signed)
Cardiology Office Note Date:  10/25/2015   ID:  Raye SorrowDexter Stanley, DOB 04/30/1946, MRN [id number]  PCP:  Alois ClicheSohail N Bazel, MD  Cardiologist:  Tonny Bollmanooper, Mohanad Carsten, MD    Chief Complaint  Patient presents with  . systolic heart failure,chronic    has some sob, but remains unchanged from previous visits. Denies cp,lee,or claudication     History of Present Illness: Nicholas Stanley is a 70 y.o. male who presents for follow-up evaluation. He has advanced cardiac disease with hx of multiple MI's, emergency CABG in 2008, mitral valve repair, and advanced chronic heart failure with LVEF 30%. He has never been interested in pursuing advanced heart failure evaluation or device therapies.   He was hospitalized recently with nausea and dizziness. He was felt to have low output heart failure. He was seen by the advanced heart failure team, but he did not wish to have further treatment or evaluation. He was discharged home after a period of diuresis. His heart failure regimen was adjusted with discontinuation of several medicines because of hypotension. He now is only on diuretics. He presents today for hospital follow-up. He is feeling somewhat better. Dizziness has improved. Shortness of breath is unchanged, but he does not feel short of breath at rest. Denies any chest pain or pressure. He denies edema, orthopnea, or pedal. He complains of discomfort intermittently at a site of an inguinal hernia.  Past Medical History  Diagnosis Date  . CAD (coronary artery disease) 2008    s/p CABG  . HYPERLIPIDEMIA-MIXED   . RENAL ARTERY STENOSIS   . Old MI (myocardial infarction) 2008  . Ischemic cardiomyopathy     a. has refused CRT-D in past;  b. echo 4/16: EF 25-30%, diffuse HK, no aortic stenosis, s/p MV repair with residual mild MR, mild LAE, moderate RAE, mild RVE, moderate TR, PASP 55  . COPD (chronic obstructive pulmonary disease) [facility](HCC)     Past Surgical History  Procedure Laterality Date  . Coronary artery  bypass graft  2008    LIMA-LAD, SVG-D1, SVG-OM1, SVG-PDA, MV repair w/ 30 mm Physio annuloplasty ring  . Mitral valve annuloplasty  2008    30 mm Physio ring    Current Outpatient Prescriptions  Medication Sig Dispense Refill  . ADVAIR DISKUS 250-50 MCG/DOSE AEPB Take 1 puff by mouth 2 (two) times daily. Reported on 10/19/2015  0  . allopurinol (ZYLOPRIM) 100 MG tablet Take 200 mg by mouth daily.     Marland Kitchen. aspirin 81 MG tablet Take 81 mg by mouth daily.      . Ipratropium-Albuterol (COMBIVENT) 20-100 MCG/ACT AERS respimat Inhale 1 puff into the lungs 4 (four) times daily as needed for wheezing or shortness of breath. Reported on 10/19/2015    . metolazone (ZAROXOLYN) 2.5 MG tablet Take one tablet by mouth as needed 30 minutes prior to your morning dosage of Furosemide for weight gain 30 tablet 3  . omeprazole (PRILOSEC) 20 MG capsule Take 1 capsule (20 mg total) by mouth daily. 30 capsule 0  . potassium chloride SA (KLOR-CON M20) 20 MEQ tablet Take 1 tablet (20 mEq total) by mouth 2 (two) times daily. 60 tablet 11  . simvastatin (ZOCOR) 40 MG tablet Take 40 mg by mouth daily at 6 PM.    . torsemide (DEMADEX) 20 MG tablet Take 2 tablets (40 mg total) by mouth daily. 120 tablet 0   No current facility-administered medications for this visit.    Allergies:   Review of patient's allergies indicates no  known allergies.   Social History:  The patient  reports that he has quit smoking. He does not have any smokeless tobacco history on file. He reports that he does not drink alcohol or use illicit drugs.   Family History:  The patient's  family history includes Cancer in his paternal grandfather; Coronary artery disease in his other; Diabetes in his brother; Heart attack in his father; Hypertension in his father; Stroke in his mother.    ROS:  Please see the history of present illness.  Otherwise, review of systems is positive for exertional dyspnea, cough, easy bruising, nausea.  All other systems are  reviewed and negative.    PHYSICAL EXAM: VS:  BP 104/68 mmHg  Pulse 76  Ht 5\' 9"  (1.753 m)  Wt 157 lb 3.2 oz (71.305 kg)  BMI 23.20 kg/m2 , BMI Body mass index is 23.2 kg/(m^2). GEN: chronically ill-appearing male, in no acute distress HEENT: poor dentition Neck: JVP elevated, no masses. No carotid bruits Cardiac: RRR without murmur or gallop                Respiratory:  clear to auscultation bilaterally, normal work of breathing GI: soft, nontender, nondistended, + BS MS: no deformity or atrophy Ext: no pretibial edema Skin: warm and dry, no rash Neuro:  Strength and sensation are intact Psych: euthymic mood, full affect  EKG:  EKG is not ordered today.  Recent Labs: 10/19/2015: B Natriuretic Peptide 549.0*; Magnesium 2.7* 10/20/2015: ALT 10*; Hemoglobin 10.8*; Platelets 138* 10/21/2015: BUN 89*; Creatinine, Ser 2.82*; Potassium 4.4; Sodium 139   Lipid Panel     Component Value Date/Time   CHOL 105 09/26/2012 0925   TRIG 77.0 09/26/2012 0925   HDL 42.40 09/26/2012 0925   CHOLHDL 2 09/26/2012 0925   VLDL 15.4 09/26/2012 0925   LDLCALC 47 09/26/2012 0925   LDLDIRECT 91.2 03/22/2009 1119      Wt Readings from Last 3 Encounters:  10/25/15 157 lb 3.2 oz (71.305 kg)  10/20/15 165 lb 12.8 oz (75.206 kg)  06/01/15 165 lb 12.8 oz (75.206 kg)     Cardiac Studies Reviewed: 2D Echo: Study Conclusions  - Left ventricle: Diffuse hypokinesis apical and anterior akinesis  cannot r/o apical thrombus. The cavity size was severely dilated.  Wall thickness was normal. Systolic function was severely  reduced. The estimated ejection fraction was in the range of 20%  to 25%. Diffuse hypokinesis. The study is not technically  sufficient to allow evaluation of LV diastolic function. - Mitral valve: Post repair with mild residual central MR. Valve  area by continuity equation (using LVOT flow): 0.72 cm^2. - Left atrium: The atrium was mildly dilated. - Right ventricle: The cavity  size was mildly dilated. Wall  thickness was normal. - Right atrium: The atrium was mildly dilated. - Tricuspid valve: There was moderate regurgitation.  ASSESSMENT AND PLAN: 1.  Chronic systolic heart failure, NYHA functional class IV: The patient has advanced heart failure. Unfortunately he has never been interested in advanced therapies. He has developed progressive kidney disease. He has signs of low output and cannot tolerate an aggressive heart failure regimen. He is treated symptomatically with diuretics. Palliative care is appropriate but he has declined this. He will follow-up in 4 weeks. We will check a metabolic panel and BNP at that time.  2. Coronary artery disease, native vessel: He has advanced ischemic heart disease. He does not have anginal symptoms. Current medications will be continued.  3. CKD stage IV: Continue observation.  Not a candidate for ACE/ARB because of low output heart failure.  Current medicines are reviewed with the patient today.  The patient does not have concerns regarding medicines.  Labs/ tests ordered today include:  No orders of the defined types were placed in this encounter.    Disposition:   FU 4 weeks with Lorin Picket Weave, PA-C.   Enzo Bi, MD  10/25/2015 10:57 AM    [facility] [address] [place], [place], [place]  [address] Phone: [phone]; Fax: [phone]

## 2015-10-25 NOTE — Patient Instructions (Addendum)
Medication Instructions:  Your physician recommends that you continue on your current medications as directed. Please refer to the Current Medication list given to you today.  Labwork: Your physician recommends that you return for lab work in: 4 WEEKS (BMP and BNP)--The pt would like to have this drawn by PCP Dr Fredrich BirksBazel, if not performed by PCP then this can be drawn at 11/24/15 appointment  Testing/Procedures: No new orders.   Follow-Up: Your physician recommends that you schedule a follow-up appointment in: 4 WEEKS with Tereso NewcomerScott Weaver PA-C    Any Other Special Instructions Will Be Listed Below (If Applicable).     If you need a refill on your cardiac medications before your next appointment, please call your pharmacy.

## 2015-10-27 ENCOUNTER — Ambulatory Visit: Payer: Medicare Other | Admitting: Physician Assistant

## 2015-11-23 NOTE — Progress Notes (Signed)
Cardiology Office Note:    Date:  11/24/2015   ID:  Nicholas Stanley, DOB 05/10/1946, MRN [id number]  PCP:  Alois ClicheSohail N Bazel, MD  Cardiologist:  Dr. Tonny BollmanMichael Cooper   Electrophysiologist:  n/a  Referring MD: Alois ClicheBazel, Sohail N, MD   Chief Complaint  Patient presents with  . Congestive Heart Failure    Follow up    History of Present Illness:     Nicholas SorrowDexter Merta is a 70 y.o. male with a hx of CAD, status post emergent CABG + MV repair 02/2007, ischemic cardiomyopathy, systolic CHF, HTN, CKD, HL, renal artery stenosis. Patient has declined CRT or ICD in the past.  Echo in 6/17 demonstrated EF 20-25, well functioning MV repair with mild MR, mod TR.  Patient was admitted last month with decompensated heart failure and worsening renal function with associated nausea and vomiting. He was seen by the heart failure team.he was last seen by Dr. Excell Seltzerooper 10/25/15. The patient had symptoms of low output heart failure. Palliative care was discussed but he declined this. He returns for close follow-up.   He returns for FU.  He is here alone.  He looks out of breath when I walk into the room. He notes dyspnea at rest and with minimal activity, orthopnea, PND.  LE edema is stable.  He denies chest pain.  He denies syncope.    Labs with PCP 11/23/15: K 3.2, Cr 2.70, Na 141  Past Medical History  Diagnosis Date  . CAD (coronary artery disease) 2008    s/p CABG + MV repair  . Old MI (myocardial infarction) 2008  . Ischemic cardiomyopathy     a. has refused CRT-D in past  . Chronic systolic CHF (congestive heart failure) (HCC)     a. echo 4/16: EF 25-30%, diffuse HK, no aortic stenosis, s/p MV repair with residual mild MR, mild LAE, moderate RAE, mild RVE, moderate TR, PASP 55 // b. Echo 6/17:  diffuse HK, apical and anterior AK, EF 20-25%, MV repair stable with mild residual central MR, mild LAE, mild RVE, mild RAE, moderate TR  . HLD (hyperlipidemia)   . Renal artery stenosis (HCC)   . COPD (chronic obstructive  pulmonary disease) (HCC)   . HTN (hypertension)   . CKD (chronic kidney disease)   . Carotid artery disease (HCC)     Bilateral ICA 0-39% >> FU 1 year    Past Surgical History  Procedure Laterality Date  . Coronary artery bypass graft  2008    LIMA-LAD, SVG-D1, SVG-OM1, SVG-PDA, MV repair w/ 30 mm Physio annuloplasty ring  . Mitral valve annuloplasty  2008    30 mm Physio ring    Current Medications: Outpatient Prescriptions Prior to Visit  Medication Sig Dispense Refill  . ADVAIR DISKUS 250-50 MCG/DOSE AEPB Take 1 puff by mouth 2 (two) times daily. Reported on 10/19/2015  0  . allopurinol (ZYLOPRIM) 100 MG tablet Take 200 mg by mouth daily.     Marland Kitchen. aspirin 81 MG tablet Take 81 mg by mouth daily.      . Ipratropium-Albuterol (COMBIVENT) 20-100 MCG/ACT AERS respimat Inhale 1 puff into the lungs 4 (four) times daily as needed for wheezing or shortness of breath. Reported on 10/19/2015    . metolazone (ZAROXOLYN) 2.5 MG tablet Take one tablet by mouth as needed 30 minutes prior to your morning dosage of Furosemide for weight gain 30 tablet 3  . omeprazole (PRILOSEC) 20 MG capsule Take 1 capsule (20 mg total) by mouth daily. 30  capsule 0  . simvastatin (ZOCOR) 40 MG tablet Take 40 mg by mouth daily at 6 PM.    . potassium chloride SA (KLOR-CON M20) 20 MEQ tablet Take 1 tablet (20 mEq total) by mouth 2 (two) times daily. 60 tablet 11  . torsemide (DEMADEX) 20 MG tablet Take 2 tablets (40 mg total) by mouth daily. 120 tablet 0   No facility-administered medications prior to visit.      Allergies:   Review of patient's allergies indicates no known allergies.   Social History   Social History  . Marital Status: Single    Spouse Name: N/A  . Number of Children: N/A  . Years of Education: N/A   Social History Main Topics  . Smoking status: Former Games developermoker  . Smokeless tobacco: None     Comment: PATIENT HAD NOT SMOKE FOR TWO MONTHS  . Alcohol Use: No  . Drug Use: No  . Sexual Activity:  No   Other Topics Concern  . None   Social History Narrative     Family History:  The patient's family history includes Cancer in his paternal grandfather; Coronary artery disease in his other; Diabetes in his brother; Heart attack in his father; Hypertension in his father; Stroke in his mother.   ROS:   Please see the history of present illness.    Review of Systems  Cardiovascular: Positive for dyspnea on exertion.  Respiratory: Positive for shortness of breath and wheezing.   Hematologic/Lymphatic: Bruises/bleeds easily.   All other systems reviewed and are negative.   Physical Exam:    VS:  BP 100/70 mmHg  Pulse 78  Ht 5\' 9"  (1.753 m)  Wt 167 lb (75.751 kg)  BMI 24.65 kg/m2  SpO2 97%   Physical Exam  Constitutional: He is oriented to person, place, and time.  Chronically ill appearing visibly short of breath at times.  HENT:  Head: Normocephalic and atraumatic.  Neck: JVD present.  Cardiovascular: Normal rate and regular rhythm.   Murmur heard.  Medium-pitched systolic murmur is present with a grade of 2/6  at the lower left sternal border Pulmonary/Chest:  Bibasilar rales, no wheezing  Abdominal: He exhibits distension.  Musculoskeletal:  1-2+ bilateral LE edema  Neurological: He is alert and oriented to person, place, and time.  Skin: Skin is warm and dry.  Psychiatric: He has a normal mood and affect.    Wt Readings from Last 3 Encounters:  11/24/15 167 lb (75.751 kg)  10/25/15 157 lb 3.2 oz (71.305 kg)  10/20/15 165 lb 12.8 oz (75.206 kg)      Studies/Labs Reviewed:     EKG:  EKG is  ordered today.  The ekg ordered today demonstrates NSR, HR 77, LBBB, no changes from last tracing.  Recent Labs: 10/19/2015: B Natriuretic Peptide 549.0*; Magnesium 2.7* 10/20/2015: ALT 10*; Hemoglobin 10.8*; Platelets 138* 10/21/2015: BUN 89*; Creatinine, Ser 2.82*; Potassium 4.4; Sodium 139   Recent Lipid Panel    Component Value Date/Time   CHOL 105 09/26/2012 0925     TRIG 77.0 09/26/2012 0925   HDL 42.40 09/26/2012 0925   CHOLHDL 2 09/26/2012 0925   VLDL 15.4 09/26/2012 0925   LDLCALC 47 09/26/2012 0925   LDLDIRECT 91.2 03/22/2009 1119    Additional studies/ records that were reviewed today include:   Echo 10/20/15 - Left ventricle: Diffuse hypokinesis apical and anterior akinesis  cannot r/o apical thrombus. The cavity size was severely dilated.  Wall thickness was normal. Systolic function was severely  reduced. The estimated ejection fraction was in the range of 20%  to 25%. Diffuse hypokinesis. The study is not technically  sufficient to allow evaluation of LV diastolic function. - Mitral valve: Post repair with mild residual central MR. Valve  area by continuity equation (using LVOT flow): 0.72 cm^2. - Left atrium: The atrium was mildly dilated. - Right ventricle: The cavity size was mildly dilated. Wall  thickness was normal. - Right atrium: The atrium was mildly dilated. - Tricuspid valve: There was moderate regurgitation.  Echo 09/03/14 - EF 25% to 30%.Diffuse hypokinesis. - Aortic valve: Small systolic gradient no stenosis. - Mitral valve: Post repair and annuloplasty ring mild residual MR. - Left atrium: The atrium was mildly dilated. - Right ventricle: The cavity size was mildly dilated. - Right atrium: The atrium was moderately dilated. - Atrial septum: No defect or patent foramen ovale was identified. - Tricuspid valve: There was moderate regurgitation. - Pulmonary arteries: PA peak pressure: 55 mm Hg (S). - Impressions: E/E&' elevated but not reflective of EDP with annuloplasty ring.  Carotid US 08/2011 Bilateral ICA 0-39% >> FU 1 year  Echo 09/2007 EF 20%, inferoposterior AK, apical inferoseptal AK, restrictive physiology AV with restricted motion of left coronary cusp Fibrocalcific change of aortic root Mitral ring annuloplasty with mild-moderate MR, mean gradient 3 mmHg Moderate LAE Mild RVE, moderately  reduced RVSF, peak RVSP moderately increased Moderately increased PASP Moderate TR Mild RAE  Cardiac cath 02/24/07 LM: Ruptured plaque in the ostium/proximal portion, distal 20-30% LAD: Stents in proximal and mid and D1 patent LCx: Mid stent patent RCA: Proximal ulcerated plaque 50%, distal occluded EF: 20-30%  ASSESSMENT:     1. Acute on chronic systolic CHF (congestive heart failure), NYHA class 4 (HCC)   2. Ischemic cardiomyopathy   3. Coronary artery disease involving native coronary artery of native heart without angina pectoris   4. CKD (chronic kidney disease) stage 4, GFR 15-29 ml/min (HCC)     PLAN:     In order of problems listed above:  1. A/C systolic CHF (congestive heart failure):  He remains volume overloaded.  He is NYHA 3-4.  He has declined palliative care and advanced HF therapies.  He is not interested in going back to the hospital.  His weight is up 10 lbs since his last visit.  He seems to be compliant with his medications.  His Creatinine remains elevated.  His K+ has run low and his PCP increased his K+ supplement recently.  He has low output failure.  His BP is too low to tolerate hydralazine, nitrates, beta-blocker.   -  Increase Torsemide to 40 mg bid  -  Increase K+ to 40 mEq TID  -  BMET 1 week  -  FU here with me in 1 month.   2. Ischemic cardiomyopathy:   EF 20-25%. He has declined ICD in the past.  As noted, he cannot tolerate med Rx.  3. Coronary artery disease:  No angina.  Continue ASA, statin.   4. Chronic kidney disease, stage III (moderate):  SCr remains ~ 2.7-2.8.  FU BMET 1 week.   Medication Adjustments/Labs and Tests Ordered: Current medicines are reviewed at length with the patient today.  Concerns regarding medicines are outlined above.  Medication changes, Labs and Tests ordered today are outlined in the Patient Instructions noted below. Patient Instructions  Medication Instructions:  1. INCREASE POTASSIUM TO 40 MEQ THREE TIMES  DAILY 2. INCREASE TORSEMIDE TO 40 MG TWICE DAILY Labwork: YOU  HAVE BEEN GIVEN A PRESCRIPTION TO HAVE LAB WORK DONE IN 1 WEEK AT YOUR PRIMARY CARE; WITH THE RESULTS TO BE FAXED TO [place], [facility] [phone] Testing/Procedures: NONE Follow-Up: 01/09/16 @ 10:45 WITH Nattie Lazenby, PAC  Any Other Special Instructions Will Be Listed Below (If Applicable). If you need a refill on your cardiac medications before your next appointment, please call your pharmacy.    Signed, Tereso Newcomer, PA-C  11/24/2015 1:15 PM    [facility] [address] [place], [place], [place]  [address] Phone: [phone]; Fax: [phone]

## 2015-11-24 ENCOUNTER — Ambulatory Visit (INDEPENDENT_AMBULATORY_CARE_PROVIDER_SITE_OTHER): Payer: Medicare Other | Admitting: Physician Assistant

## 2015-11-24 ENCOUNTER — Encounter: Payer: Self-pay | Admitting: Physician Assistant

## 2015-11-24 VITALS — BP 100/70 | HR 78 | Ht 69.0 in | Wt 167.0 lb

## 2015-11-24 DIAGNOSIS — I2510 Atherosclerotic heart disease of native coronary artery without angina pectoris: Secondary | ICD-10-CM | POA: Diagnosis not present

## 2015-11-24 DIAGNOSIS — I255 Ischemic cardiomyopathy: Secondary | ICD-10-CM

## 2015-11-24 DIAGNOSIS — I5023 Acute on chronic systolic (congestive) heart failure: Secondary | ICD-10-CM

## 2015-11-24 DIAGNOSIS — N184 Chronic kidney disease, stage 4 (severe): Secondary | ICD-10-CM | POA: Insufficient documentation

## 2015-11-24 MED ORDER — POTASSIUM CHLORIDE CRYS ER 20 MEQ PO TBCR: 40.0000 meq | EXTENDED_RELEASE_TABLET | Freq: Three times a day (TID) | ORAL | Status: AC

## 2015-11-24 MED ORDER — TORSEMIDE 20 MG PO TABS: 40.0000 mg | ORAL_TABLET | Freq: Two times a day (BID) | ORAL | Status: AC

## 2015-11-24 NOTE — Patient Instructions (Addendum)
Medication Instructions:  1. INCREASE POTASSIUM TO 40 MEQ THREE TIMES DAILY 2. INCREASE TORSEMIDE TO 40 MG TWICE DAILY Labwork: YOU HAVE BEEN GIVEN A PRESCRIPTION TO HAVE LAB WORK DONE IN 1 WEEK AT YOUR PRIMARY CARE; WITH THE RESULTS TO BE FAXED TO [place], [facility] [phone] Testing/Procedures: NONE Follow-Up: 01/09/16 @ 10:45 WITH SCOTT WEAVER, PAC  Any Other Special Instructions Will Be Listed Below (If Applicable). If you need a refill on your cardiac medications before your next appointment, please call your pharmacy.

## 2015-11-30 ENCOUNTER — Telehealth: Payer: Self-pay | Admitting: *Deleted

## 2015-11-30 ENCOUNTER — Other Ambulatory Visit (INDEPENDENT_AMBULATORY_CARE_PROVIDER_SITE_OTHER): Payer: Medicare Other | Admitting: *Deleted

## 2015-11-30 DIAGNOSIS — I5022 Chronic systolic (congestive) heart failure: Secondary | ICD-10-CM

## 2015-11-30 DIAGNOSIS — E876 Hypokalemia: Secondary | ICD-10-CM

## 2015-11-30 LAB — BASIC METABOLIC PANEL
BUN: 62 mg/dL — AB (ref 7–25)
CALCIUM: 8.2 mg/dL — AB (ref 8.6–10.3)
CO2: 28 mmol/L (ref 20–31)
CREATININE: 2.54 mg/dL — AB (ref 0.70–1.25)
Chloride: 97 mmol/L — ABNORMAL LOW (ref 98–110)
GLUCOSE: 133 mg/dL — AB (ref 65–99)
Potassium: 3 mmol/L — ABNORMAL LOW (ref 3.5–5.3)
SODIUM: 140 mmol/L (ref 135–146)

## 2015-11-30 NOTE — Telephone Encounter (Signed)
Spoke with pt and informed him of lab results. Pt states that about two weeks ago he was told by another doctor to take K+ 5720mEq-2 tabs BID. Advised pt that Lorin PicketScott would like for him to increase his K+ by 40mEq so he should take 2 tabs TID. Advised pt that we need to recheck labs in 1 week.  Labs scheduled for 7/19. Pt verbalized understanding and was in agreement with this plan.

## 2015-11-30 NOTE — Addendum Note (Signed)
Addended by: Tonita PhoenixBOWDEN, Treyce Spillers K on: 11/30/2015 08:26 AM   Modules accepted: Orders

## 2015-11-30 NOTE — Addendum Note (Signed)
Addended by: Margreat Widener K on: 11/30/2015 08:26 AM   Modules accepted: Orders  

## 2015-11-30 NOTE — Telephone Encounter (Signed)
-----   Message from Beatrice LecherScott T Weaver, [place] sent at 11/30/2015  5:05 PM EDT ----- Creatinine stable K+ low Increase daily K+ dose by 40 mEq  BMET 1 week Tereso NewcomerScott Weaver, PA-C   11/30/2015 5:05 PM

## 2015-12-07 ENCOUNTER — Encounter (INDEPENDENT_AMBULATORY_CARE_PROVIDER_SITE_OTHER): Payer: Self-pay

## 2015-12-07 ENCOUNTER — Other Ambulatory Visit (INDEPENDENT_AMBULATORY_CARE_PROVIDER_SITE_OTHER): Payer: Medicare Other | Admitting: *Deleted

## 2015-12-07 DIAGNOSIS — E876 Hypokalemia: Secondary | ICD-10-CM

## 2015-12-07 LAB — BASIC METABOLIC PANEL
BUN: 57 mg/dL — AB (ref 7–25)
CALCIUM: 8.6 mg/dL (ref 8.6–10.3)
CO2: 31 mmol/L (ref 20–31)
CREATININE: 2.48 mg/dL — AB (ref 0.70–1.25)
Chloride: 97 mmol/L — ABNORMAL LOW (ref 98–110)
GLUCOSE: 100 mg/dL — AB (ref 65–99)
Potassium: 3.1 mmol/L — ABNORMAL LOW (ref 3.5–5.3)
Sodium: 140 mmol/L (ref 135–146)

## 2015-12-08 ENCOUNTER — Telehealth: Payer: Self-pay | Admitting: *Deleted

## 2015-12-08 NOTE — Telephone Encounter (Signed)
Pt notified of lab results by phone. Asked pt if he was taking K+ 40 meq TID. Pt states no he is taking K+ 40 meq BID. Asked pt who decreased his K+, pt states he did because the 6 tabs a day made him feel weak. Pt stated not as weak taing 4 tabs daily. I advised pt his K+ is too low and we need to bring K+ up to help with his heart and kidney function. Asked pt how was his weight doing. Weight he said was doing ok he thought. Asked pt if he weighs daily which he responds to if he remembers. I asked pt to weigh daily for us. Asked pt if he had any swelling, pt answers yes a little swelling on top of his feet. Pt states he did take one of the metolazone the other day because he had some swelling. Pt has appt with Dr. Excell Seltzerooper 8/21 for his 1 month f/u. He was scheduled for 8/3 but pt had to change appt.. Pt will come in to 7/27 for bmet. I advised pt to go back to K+ 40 meq TID and lets get lab work next week and see how he feels, advised to monitor weight and call if weight is up 3 lb's x 1 day or 5 lb's x 1 week. Pt agreeable to plan of care and all instructions. I did state to pt that I will also d/w Tereso NewcomerScott Weaver, PA as to the weakness he feels on the higher dose of K+

## 2015-12-20 DEATH — deceased

## 2015-12-22 ENCOUNTER — Ambulatory Visit: Payer: Medicare Other | Admitting: Cardiovascular Disease

## 2016-01-09 ENCOUNTER — Ambulatory Visit: Payer: Medicare Other | Admitting: Cardiovascular Disease

## 2016-01-09 ENCOUNTER — Ambulatory Visit: Payer: Medicare Other | Admitting: Physician Assistant

## 2017-06-10 IMAGING — CR DG CHEST 2V
2 series · 2 of 2 positions shown · non-contrast
Comparison: PA and lateral chest 04/04/2007.

CLINICAL DATA: Shortness of breath, wheezing and cough for 1 month.

EXAM:
CHEST  2 VIEW

[chest pa]
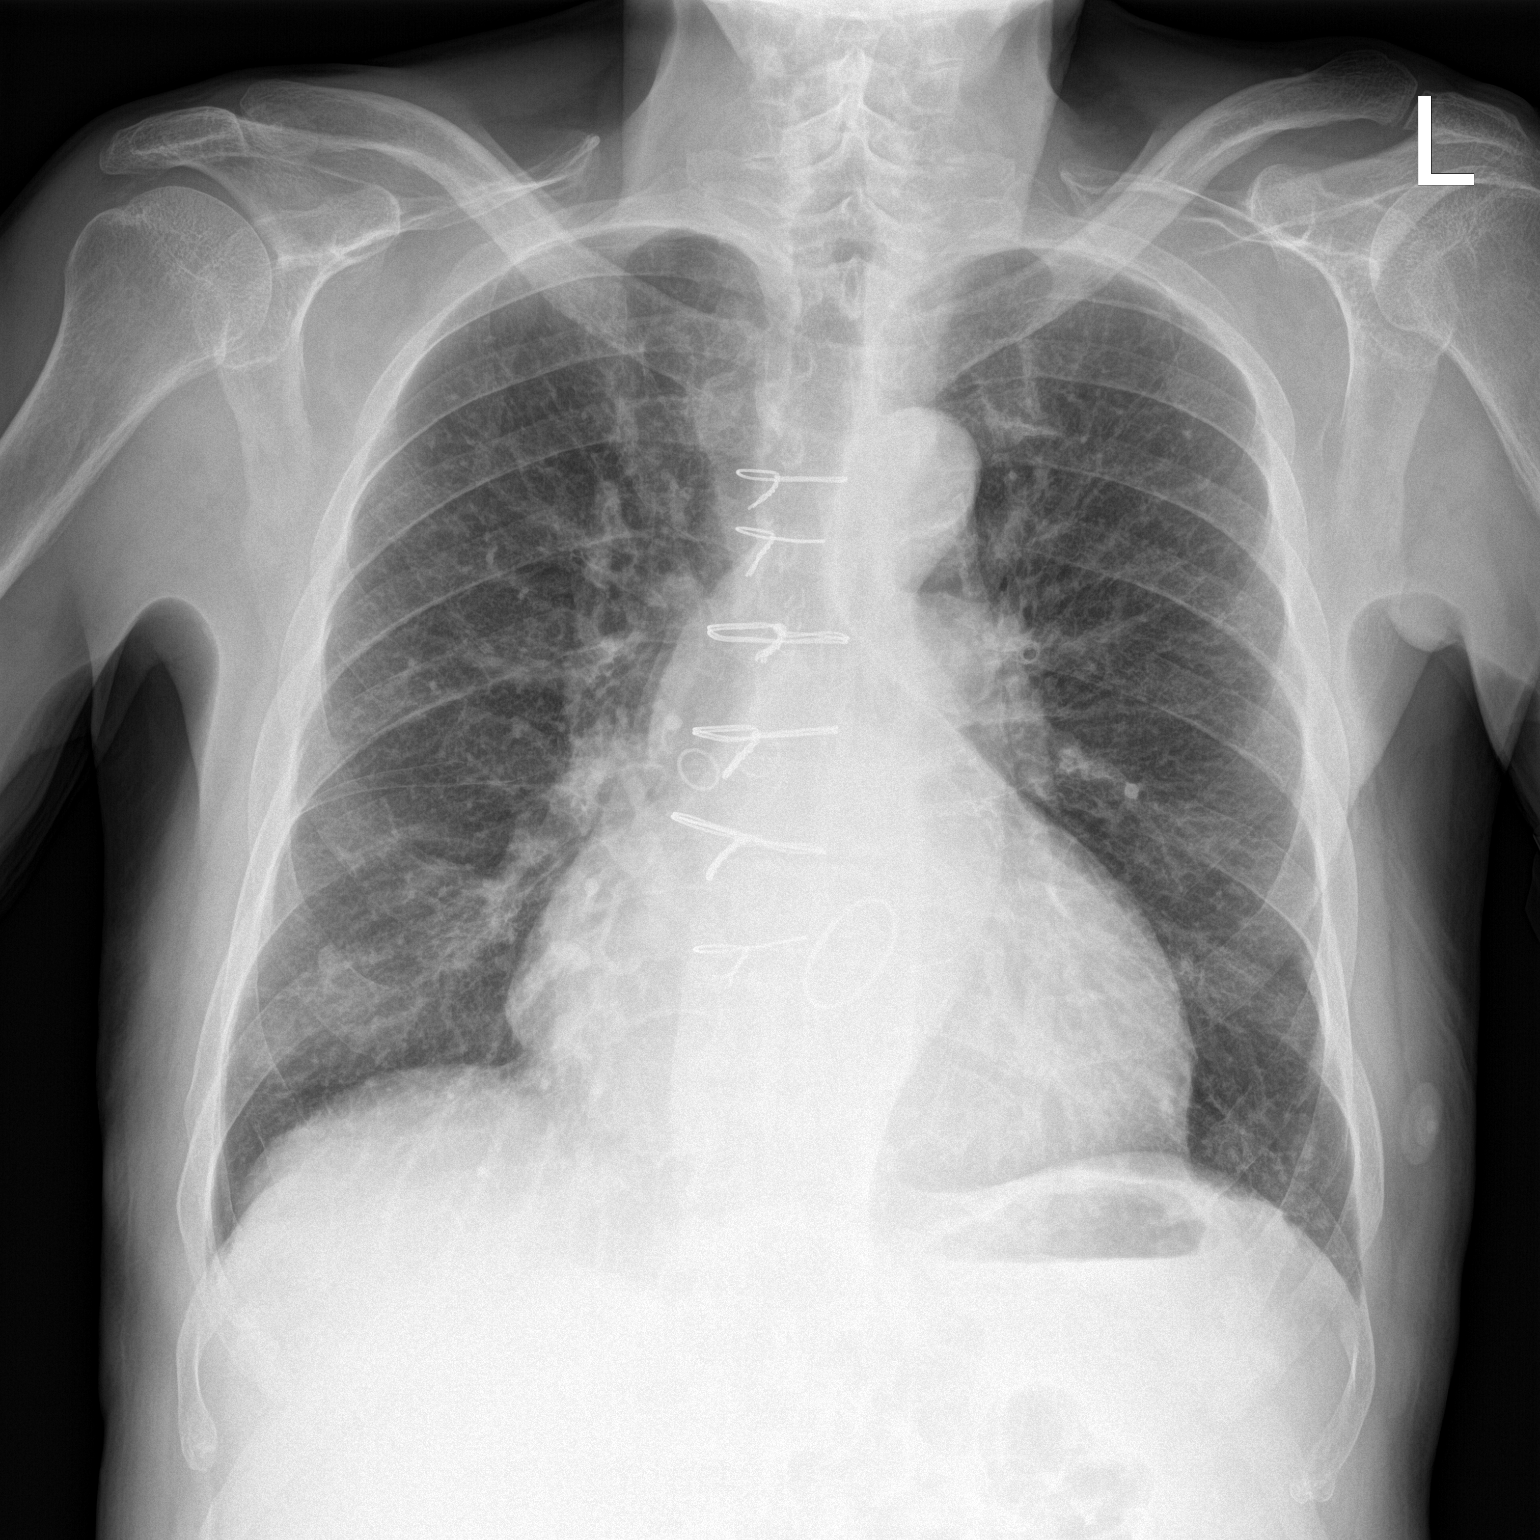

[chest lat]
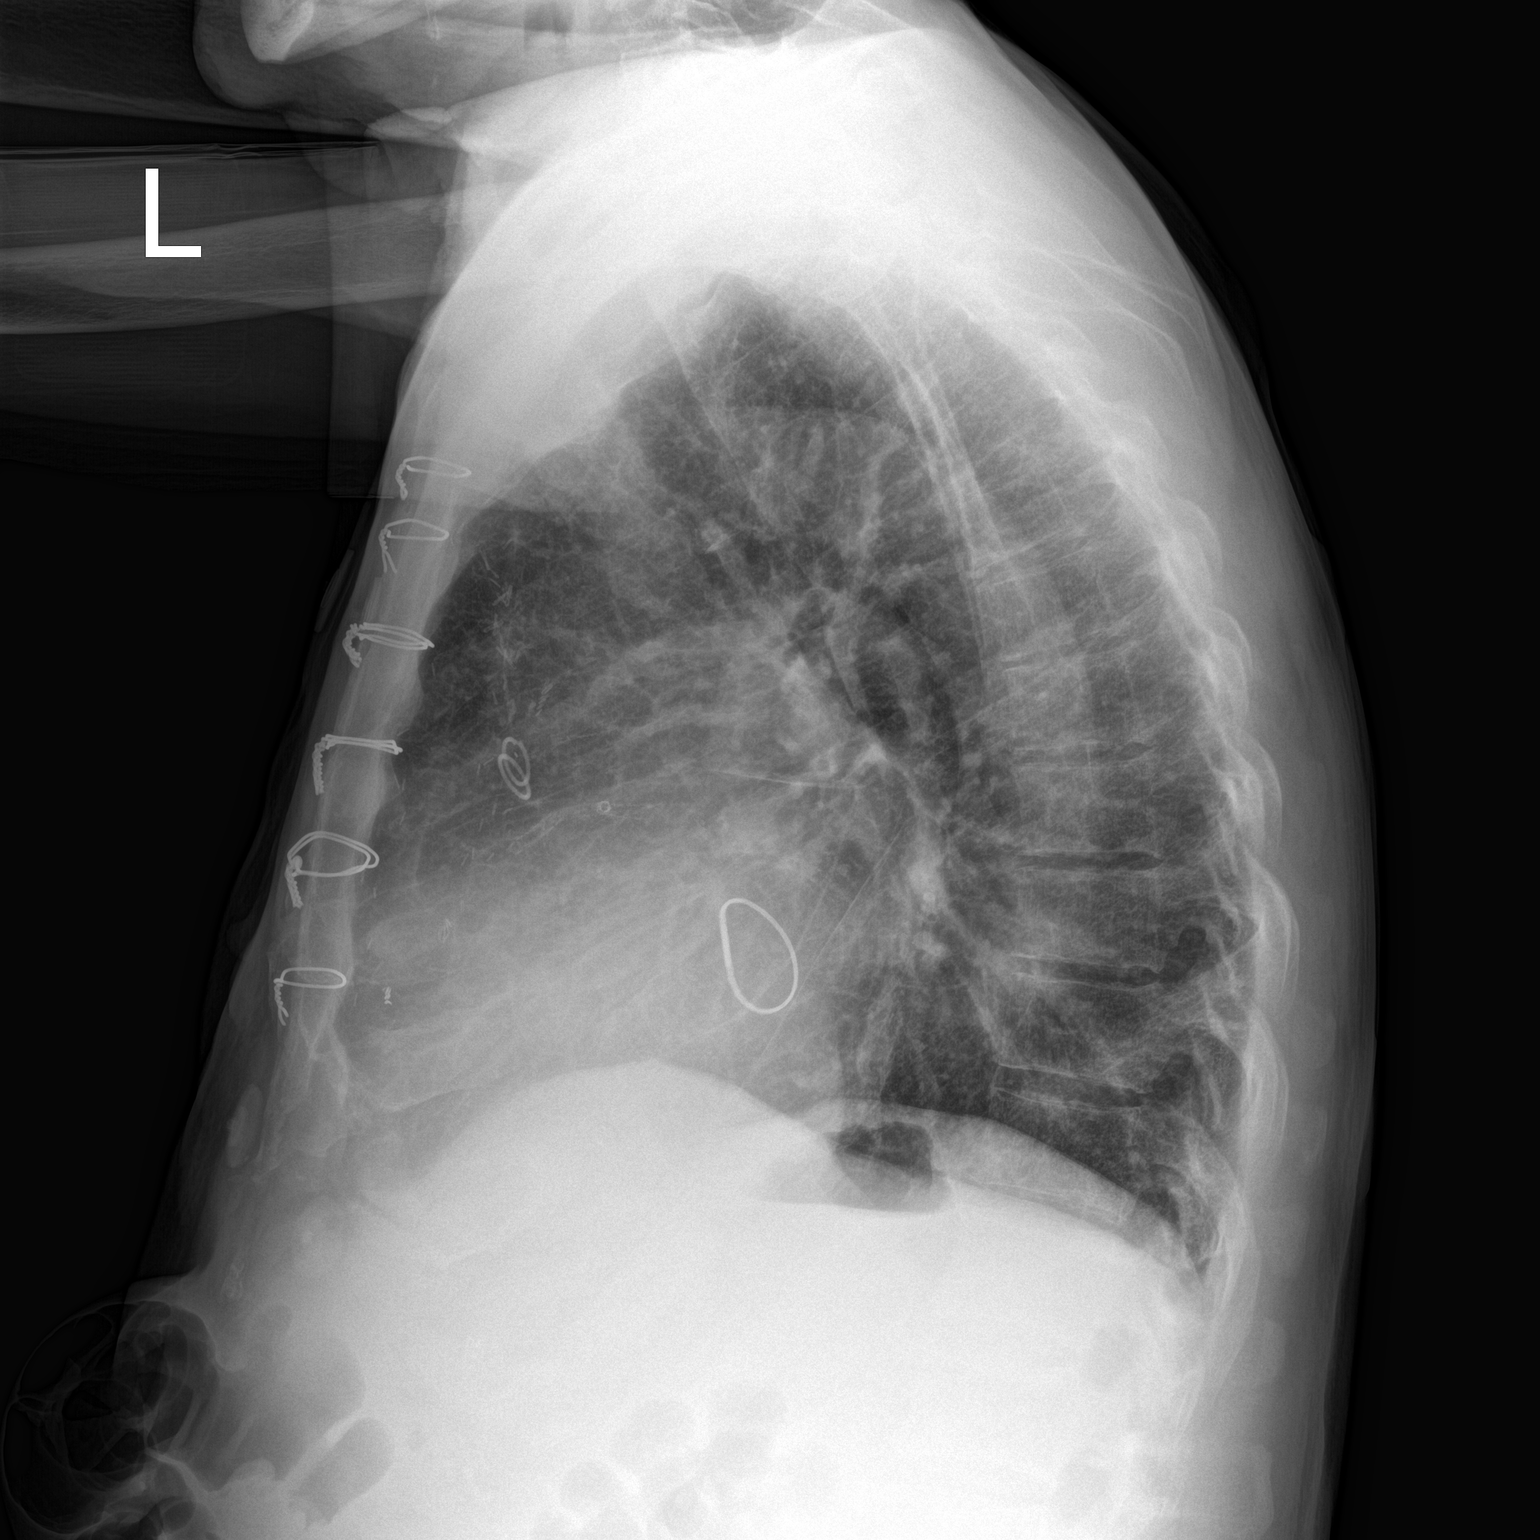

[2 of 2 positions shown; findings below may reference images not displayed]

FINDINGS: The patient is status post CABG and mitral valve repair. There is
cardiomegaly without edema. The lungs are clear. No pneumothorax or
pleural effusion. No focal bony abnormality.
IMPRESSION: Cardiomegaly without acute disease.

## 2018-03-03 IMAGING — DX DG CHEST 2V
2 series · 2 of 2 positions shown · non-contrast
Comparison: Chest radiograph from 01/26/2015

CLINICAL DATA: Acute onset of generalized weakness and nausea.
Hypokalemia. Initial encounter.

EXAM:
CHEST  2 VIEW

[chest pa]
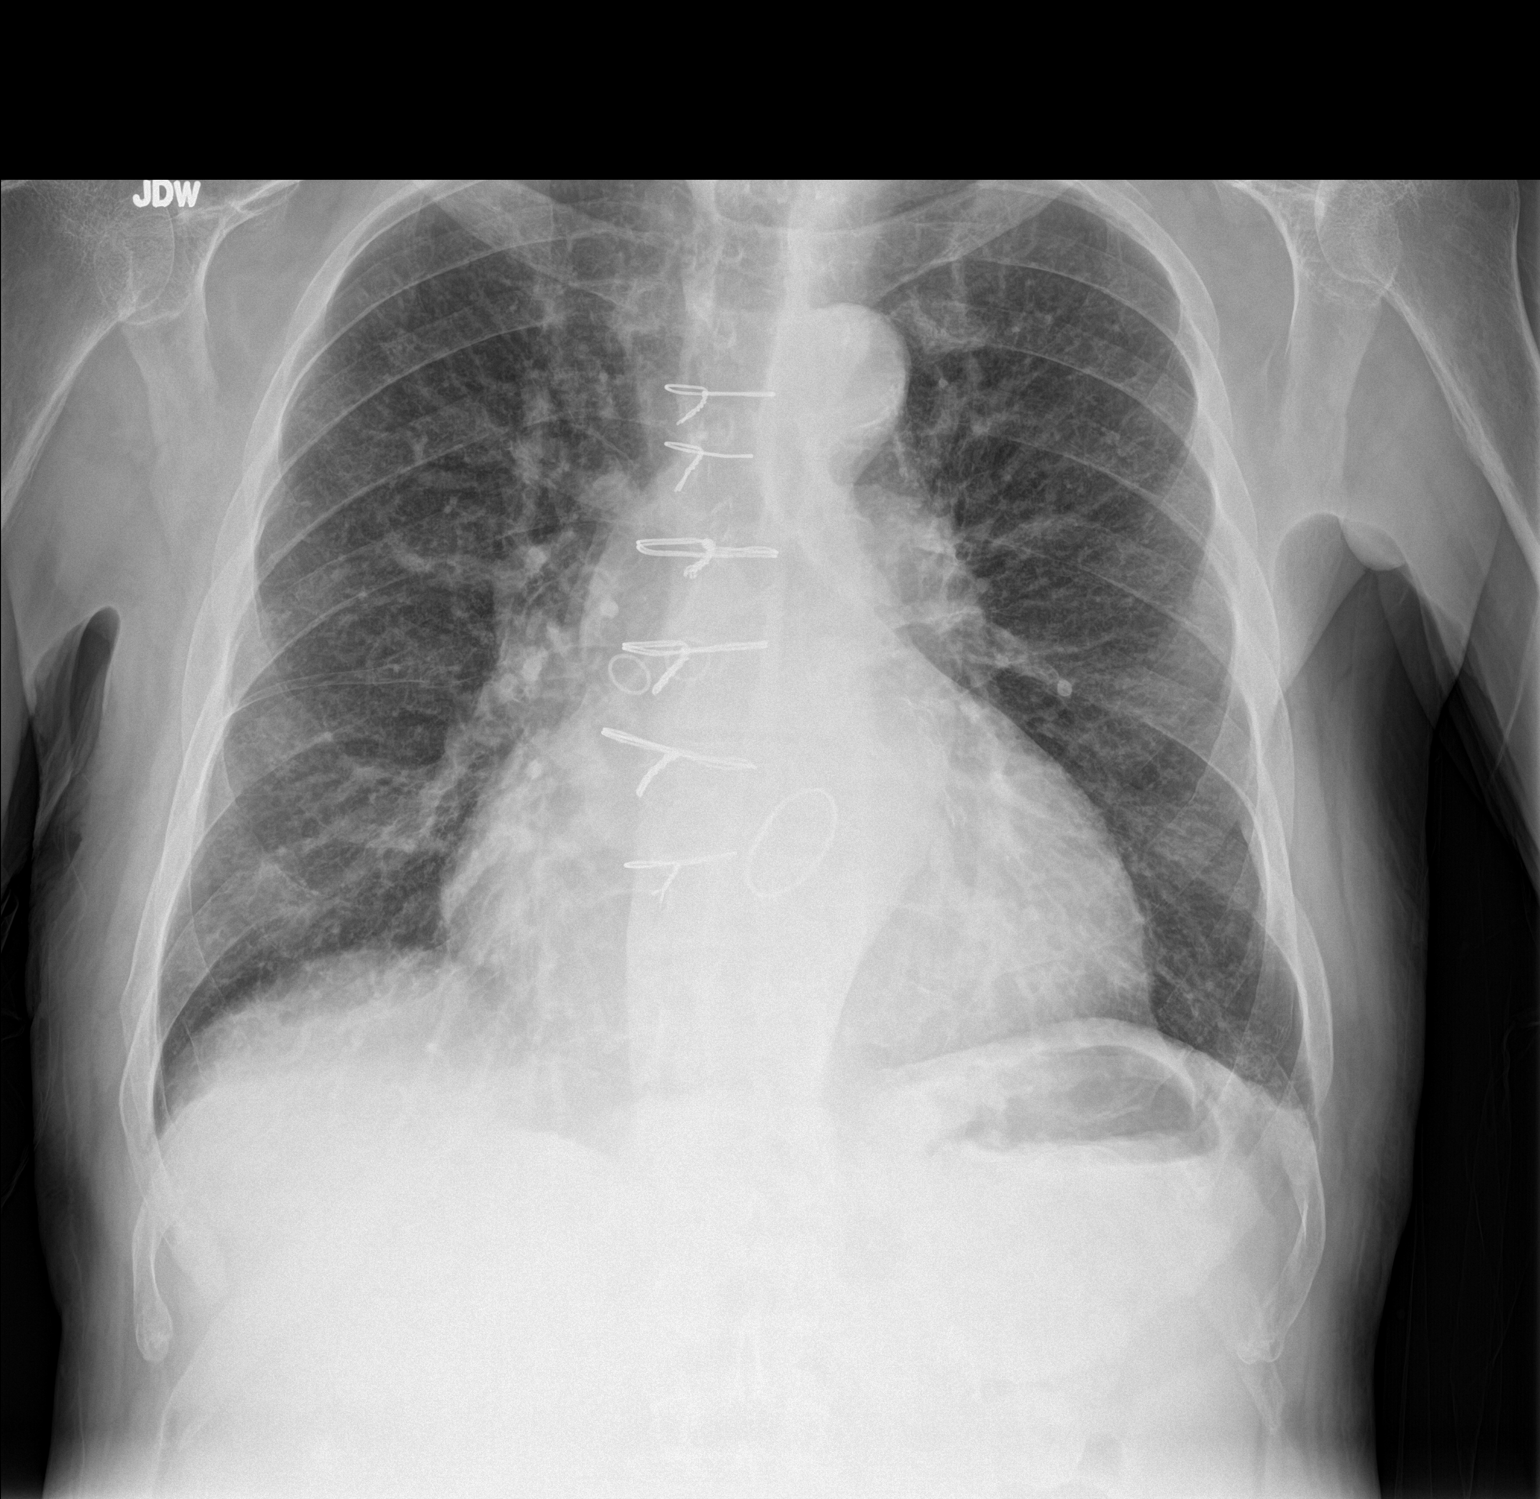

[chest lat]
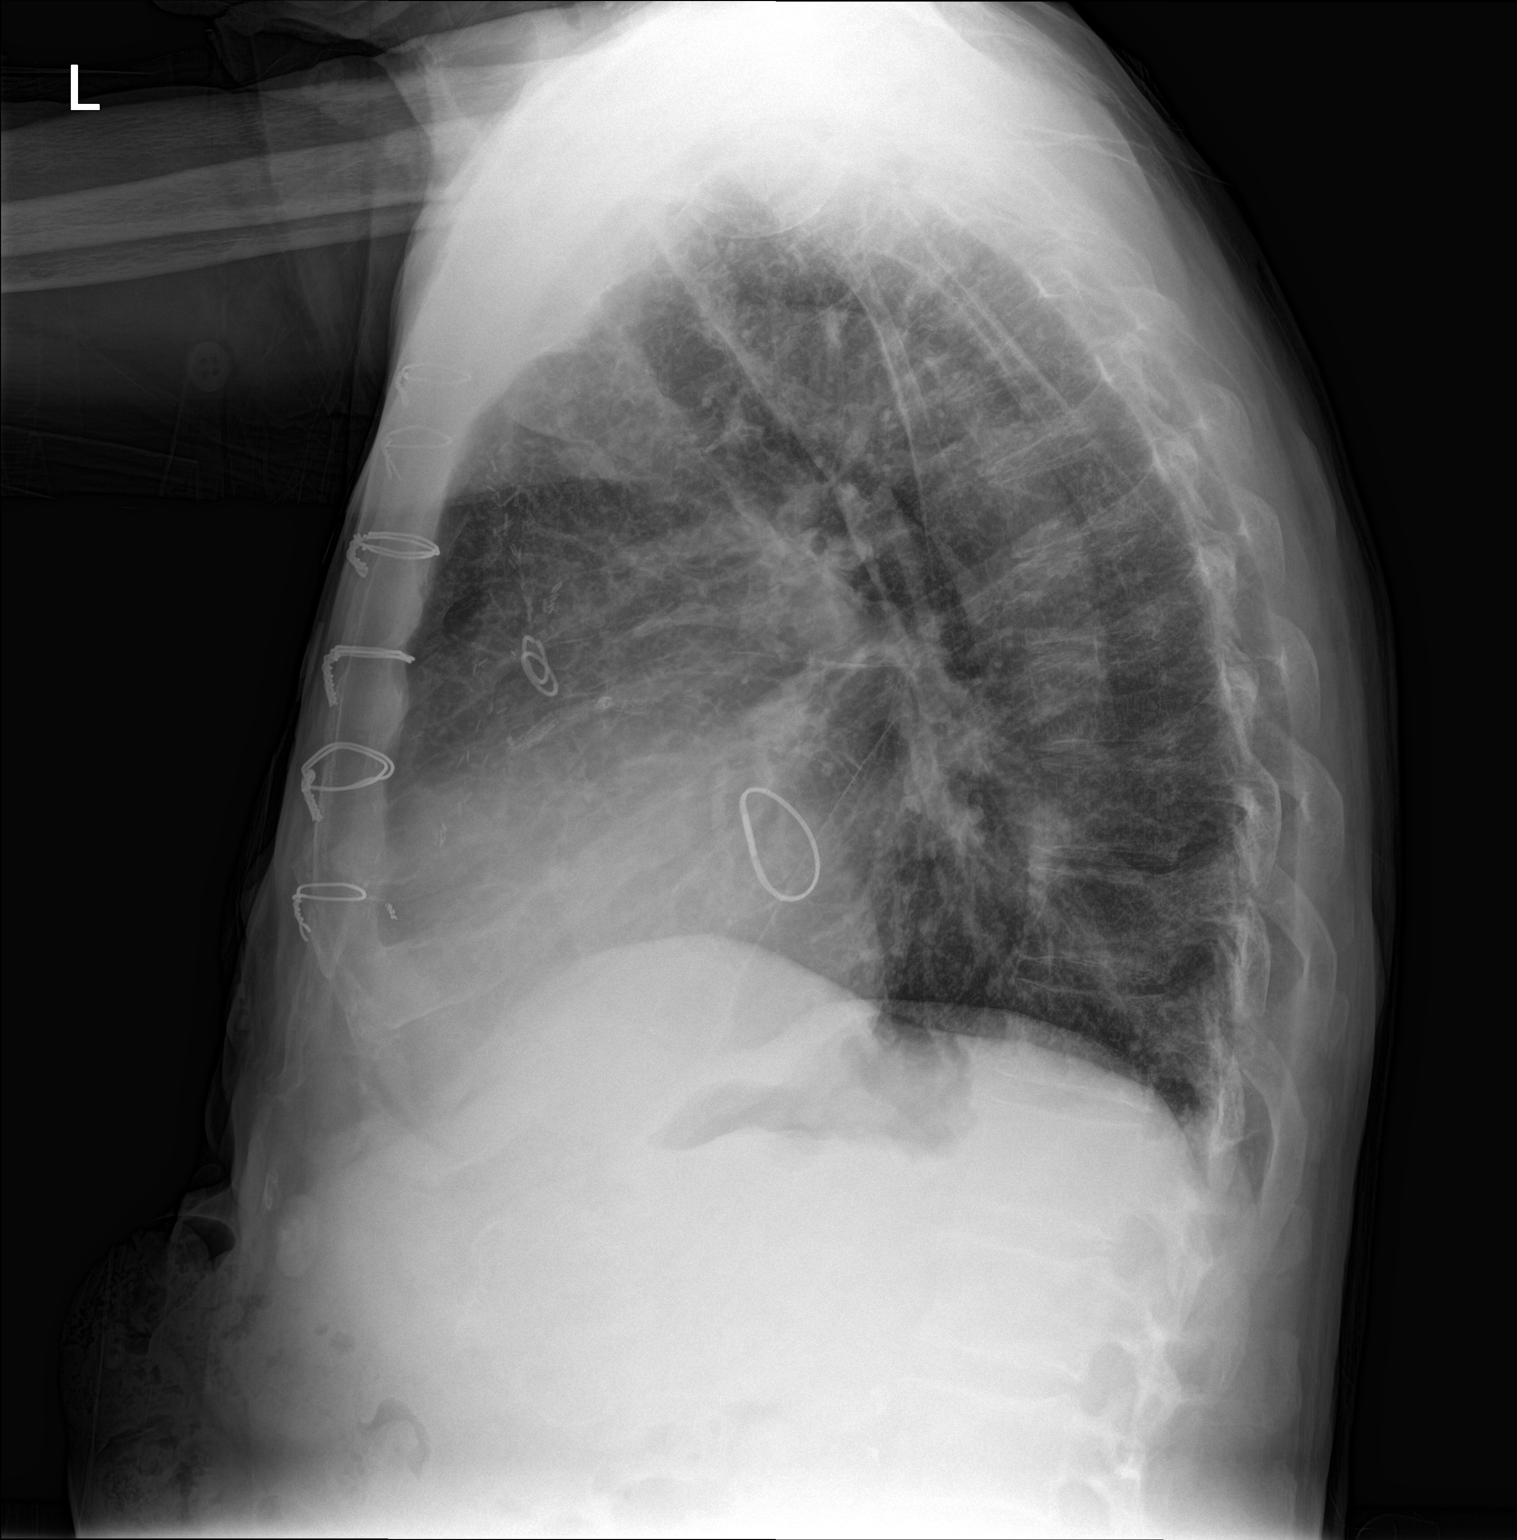

[2 of 2 positions shown; findings below may reference images not displayed]

FINDINGS: The lungs are well-aerated. Vascular congestion is noted. is no
evidence of focal opacification, pleural effusion or pneumothorax.

The heart is mildly enlarged. The patient is status post median
sternotomy, with evidence of prior CABG. A valve replacement is
noted. No acute osseous abnormalities are seen.
IMPRESSION: Vascular congestion and mild cardiomegaly. Lungs remain grossly
clear.
